# Patient Record
Sex: Male | Born: 1950
Health system: Southern US, Community
[De-identification: ages and names within clinical notes are randomized; demographics above are authoritative.]

## PROBLEM LIST (undated history)

## (undated) DIAGNOSIS — N529 Male erectile dysfunction, unspecified: Secondary | ICD-10-CM

## (undated) DIAGNOSIS — I10 Essential (primary) hypertension: Secondary | ICD-10-CM

## (undated) DIAGNOSIS — Z860101 Personal history of adenomatous and serrated colon polyps: Secondary | ICD-10-CM

## (undated) DIAGNOSIS — K219 Gastro-esophageal reflux disease without esophagitis: Secondary | ICD-10-CM

## (undated) DIAGNOSIS — I2510 Atherosclerotic heart disease of native coronary artery without angina pectoris: Secondary | ICD-10-CM

## (undated) DIAGNOSIS — Z86010 Personal history of colonic polyps: Secondary | ICD-10-CM

## (undated) DIAGNOSIS — M1990 Unspecified osteoarthritis, unspecified site: Secondary | ICD-10-CM

## (undated) DIAGNOSIS — E785 Hyperlipidemia, unspecified: Secondary | ICD-10-CM

## (undated) DIAGNOSIS — L409 Psoriasis, unspecified: Secondary | ICD-10-CM

## (undated) DIAGNOSIS — J309 Allergic rhinitis, unspecified: Secondary | ICD-10-CM

## (undated) DIAGNOSIS — M5030 Other cervical disc degeneration, unspecified cervical region: Secondary | ICD-10-CM

## (undated) DIAGNOSIS — Z974 Presence of external hearing-aid: Secondary | ICD-10-CM

## (undated) DIAGNOSIS — Z973 Presence of spectacles and contact lenses: Secondary | ICD-10-CM

## (undated) DIAGNOSIS — D45 Polycythemia vera: Secondary | ICD-10-CM

## (undated) DIAGNOSIS — G4700 Insomnia, unspecified: Secondary | ICD-10-CM

## (undated) HISTORY — PX: HAND SURGERY: SHX662

## (undated) HISTORY — DX: Polycythemia vera: D45

## (undated) HISTORY — DX: Gastro-esophageal reflux disease without esophagitis: K21.9

## (undated) HISTORY — DX: Psoriasis, unspecified: L40.9

## (undated) HISTORY — DX: Hyperlipidemia, unspecified: E78.5

## (undated) HISTORY — DX: Essential (primary) hypertension: I10

## (undated) HISTORY — DX: Insomnia, unspecified: G47.00

---

## 2005-04-01 ENCOUNTER — Ambulatory Visit: Payer: Self-pay

## 2005-06-04 ENCOUNTER — Ambulatory Visit: Payer: Self-pay

## 2005-12-12 ENCOUNTER — Ambulatory Visit: Payer: Self-pay | Admitting: General Surgery

## 2006-03-18 ENCOUNTER — Ambulatory Visit: Payer: Self-pay

## 2006-09-24 ENCOUNTER — Ambulatory Visit: Payer: Self-pay | Admitting: Internal Medicine

## 2006-10-06 ENCOUNTER — Ambulatory Visit: Payer: Self-pay | Admitting: Internal Medicine

## 2006-11-06 ENCOUNTER — Ambulatory Visit: Payer: Self-pay | Admitting: Internal Medicine

## 2006-12-05 ENCOUNTER — Ambulatory Visit: Payer: Self-pay | Admitting: Internal Medicine

## 2007-02-04 ENCOUNTER — Ambulatory Visit: Payer: Self-pay | Admitting: Internal Medicine

## 2007-03-04 ENCOUNTER — Ambulatory Visit: Payer: Self-pay | Admitting: Internal Medicine

## 2007-03-07 ENCOUNTER — Ambulatory Visit: Payer: Self-pay | Admitting: Internal Medicine

## 2007-09-06 ENCOUNTER — Ambulatory Visit: Payer: Self-pay | Admitting: Internal Medicine

## 2007-09-10 ENCOUNTER — Ambulatory Visit: Payer: Self-pay | Admitting: Internal Medicine

## 2007-10-07 ENCOUNTER — Ambulatory Visit: Payer: Self-pay | Admitting: Internal Medicine

## 2007-11-07 ENCOUNTER — Ambulatory Visit: Payer: Self-pay | Admitting: Internal Medicine

## 2007-12-05 ENCOUNTER — Ambulatory Visit: Payer: Self-pay | Admitting: Internal Medicine

## 2008-01-05 ENCOUNTER — Ambulatory Visit: Payer: Self-pay | Admitting: Internal Medicine

## 2008-01-06 ENCOUNTER — Ambulatory Visit: Payer: Self-pay | Admitting: Internal Medicine

## 2008-02-04 ENCOUNTER — Ambulatory Visit: Payer: Self-pay | Admitting: Internal Medicine

## 2008-03-06 ENCOUNTER — Ambulatory Visit: Payer: Self-pay | Admitting: Internal Medicine

## 2008-05-06 ENCOUNTER — Ambulatory Visit: Payer: Self-pay | Admitting: Internal Medicine

## 2008-06-02 ENCOUNTER — Ambulatory Visit: Payer: Self-pay | Admitting: Internal Medicine

## 2008-10-06 ENCOUNTER — Ambulatory Visit: Payer: Self-pay | Admitting: Internal Medicine

## 2008-10-25 ENCOUNTER — Ambulatory Visit: Payer: Self-pay | Admitting: Internal Medicine

## 2008-11-06 ENCOUNTER — Ambulatory Visit: Payer: Self-pay | Admitting: Internal Medicine

## 2008-12-04 ENCOUNTER — Ambulatory Visit: Payer: Self-pay | Admitting: Internal Medicine

## 2009-01-04 ENCOUNTER — Ambulatory Visit: Payer: Self-pay | Admitting: Internal Medicine

## 2009-01-11 ENCOUNTER — Ambulatory Visit: Payer: Self-pay | Admitting: Internal Medicine

## 2009-02-03 ENCOUNTER — Ambulatory Visit: Payer: Self-pay | Admitting: Internal Medicine

## 2009-09-05 ENCOUNTER — Ambulatory Visit: Payer: Self-pay | Admitting: Internal Medicine

## 2009-09-13 ENCOUNTER — Ambulatory Visit: Payer: Self-pay | Admitting: Internal Medicine

## 2009-10-06 ENCOUNTER — Ambulatory Visit: Payer: Self-pay | Admitting: Internal Medicine

## 2009-12-04 ENCOUNTER — Ambulatory Visit: Payer: Self-pay | Admitting: Internal Medicine

## 2010-01-03 ENCOUNTER — Ambulatory Visit: Payer: Self-pay | Admitting: Internal Medicine

## 2010-01-04 ENCOUNTER — Ambulatory Visit: Payer: Self-pay | Admitting: Internal Medicine

## 2010-04-05 ENCOUNTER — Ambulatory Visit: Payer: Self-pay | Admitting: Internal Medicine

## 2010-04-25 ENCOUNTER — Ambulatory Visit: Payer: Self-pay | Admitting: Internal Medicine

## 2010-05-06 ENCOUNTER — Ambulatory Visit: Payer: Self-pay | Admitting: Internal Medicine

## 2011-02-14 ENCOUNTER — Ambulatory Visit: Payer: Self-pay | Admitting: General Surgery

## 2011-07-04 ENCOUNTER — Ambulatory Visit: Payer: Self-pay | Admitting: Internal Medicine

## 2011-07-07 ENCOUNTER — Ambulatory Visit: Payer: Self-pay | Admitting: Internal Medicine

## 2011-08-07 ENCOUNTER — Ambulatory Visit: Payer: Self-pay | Admitting: Internal Medicine

## 2012-01-14 ENCOUNTER — Ambulatory Visit: Payer: Self-pay | Admitting: Internal Medicine

## 2012-02-04 ENCOUNTER — Ambulatory Visit: Payer: Self-pay | Admitting: Internal Medicine

## 2012-02-26 LAB — OCCULT BLOOD X 1 CARD TO LAB, STOOL: Occult Blood, Feces: NEGATIVE

## 2012-03-06 ENCOUNTER — Ambulatory Visit: Payer: Self-pay | Admitting: Internal Medicine

## 2012-04-05 ENCOUNTER — Ambulatory Visit: Payer: Self-pay | Admitting: Internal Medicine

## 2012-05-14 ENCOUNTER — Ambulatory Visit: Payer: Self-pay | Admitting: Internal Medicine

## 2012-05-14 LAB — OCCULT BLOOD X 1 CARD TO LAB, STOOL
Occult Blood, Feces: NEGATIVE
Occult Blood, Feces: NEGATIVE

## 2012-07-21 ENCOUNTER — Ambulatory Visit: Payer: Self-pay | Admitting: Internal Medicine

## 2012-07-23 ENCOUNTER — Ambulatory Visit: Payer: Self-pay | Admitting: Internal Medicine

## 2012-07-23 LAB — OCCULT BLOOD X 1 CARD TO LAB, STOOL
Occult Blood, Feces: NEGATIVE
Occult Blood, Feces: NEGATIVE

## 2012-08-06 ENCOUNTER — Ambulatory Visit: Payer: Self-pay | Admitting: Internal Medicine

## 2012-12-04 ENCOUNTER — Ambulatory Visit: Payer: Self-pay | Admitting: Internal Medicine

## 2013-01-04 ENCOUNTER — Ambulatory Visit: Payer: Self-pay | Admitting: Internal Medicine

## 2013-03-06 ENCOUNTER — Ambulatory Visit: Payer: Self-pay | Admitting: Internal Medicine

## 2013-03-30 LAB — OCCULT BLOOD X 1 CARD TO LAB, STOOL
Occult Blood, Feces: NEGATIVE
Occult Blood, Feces: NEGATIVE

## 2013-03-31 ENCOUNTER — Ambulatory Visit: Payer: Self-pay | Admitting: Internal Medicine

## 2013-04-05 ENCOUNTER — Ambulatory Visit: Payer: Self-pay | Admitting: Internal Medicine

## 2013-06-06 ENCOUNTER — Ambulatory Visit: Payer: Self-pay | Admitting: Internal Medicine

## 2013-07-06 ENCOUNTER — Ambulatory Visit: Payer: Self-pay | Admitting: Internal Medicine

## 2013-09-15 ENCOUNTER — Ambulatory Visit: Payer: Self-pay | Admitting: Internal Medicine

## 2013-10-06 ENCOUNTER — Ambulatory Visit: Payer: Self-pay | Admitting: Internal Medicine

## 2013-12-30 ENCOUNTER — Ambulatory Visit: Payer: Self-pay | Admitting: Internal Medicine

## 2014-01-04 ENCOUNTER — Ambulatory Visit: Payer: Self-pay | Admitting: Internal Medicine

## 2014-02-20 DIAGNOSIS — Z86018 Personal history of other benign neoplasm: Secondary | ICD-10-CM

## 2014-02-20 DIAGNOSIS — D239 Other benign neoplasm of skin, unspecified: Secondary | ICD-10-CM

## 2014-02-20 HISTORY — DX: Personal history of other benign neoplasm: Z86.018

## 2014-02-20 HISTORY — DX: Other benign neoplasm of skin, unspecified: D23.9

## 2014-03-22 ENCOUNTER — Ambulatory Visit: Payer: Self-pay | Admitting: Internal Medicine

## 2014-04-05 ENCOUNTER — Ambulatory Visit: Payer: Self-pay | Admitting: Internal Medicine

## 2014-10-04 ENCOUNTER — Ambulatory Visit: Payer: Self-pay | Admitting: Internal Medicine

## 2014-11-03 ENCOUNTER — Ambulatory Visit: Payer: Self-pay | Admitting: Hematology and Oncology

## 2014-11-03 LAB — FERRITIN: Ferritin (ARMC): 53 ng/mL (ref 8–388)

## 2014-11-03 LAB — CANCER CENTER HEMOGLOBIN: HGB: 14.8 g/dL (ref 13.0–18.0)

## 2014-11-06 ENCOUNTER — Ambulatory Visit: Payer: Self-pay | Admitting: Hematology and Oncology

## 2014-12-08 ENCOUNTER — Ambulatory Visit: Payer: Self-pay | Admitting: Gastroenterology

## 2014-12-10 DIAGNOSIS — K219 Gastro-esophageal reflux disease without esophagitis: Secondary | ICD-10-CM | POA: Insufficient documentation

## 2015-01-29 LAB — SURGICAL PATHOLOGY

## 2015-02-02 ENCOUNTER — Ambulatory Visit
Admit: 2015-02-02 | Disposition: A | Discharge status: Home / Self Care | Payer: Self-pay | Attending: Internal Medicine | Admitting: Internal Medicine

## 2015-02-02 LAB — COMPREHENSIVE METABOLIC PANEL
Albumin: 4.6 g/dL
Alkaline Phosphatase: 64 U/L
Anion Gap: 2 — ABNORMAL LOW (ref 7–16)
BUN: 35 mg/dL — ABNORMAL HIGH
Bilirubin,Total: 0.4 mg/dL
Calcium, Total: 9.3 mg/dL
Chloride: 109 mmol/L
Co2: 27 mmol/L
Creatinine: 1.54 mg/dL — ABNORMAL HIGH
EGFR (African American): 55 — ABNORMAL LOW
EGFR (Non-African Amer.): 47 — ABNORMAL LOW
Glucose: 143 mg/dL — ABNORMAL HIGH
Potassium: 4.1 mmol/L
SGOT(AST): 28 U/L
SGPT (ALT): 27 U/L
Sodium: 138 mmol/L
Total Protein: 7.1 g/dL

## 2015-02-02 LAB — CBC CANCER CENTER
Basophil #: 0 x10 3/mm (ref 0.0–0.1)
Basophil %: 0.5 %
Eosinophil #: 0.1 x10 3/mm (ref 0.0–0.7)
Eosinophil %: 1.3 %
HCT: 43.2 % (ref 40.0–52.0)
HGB: 15.1 g/dL (ref 13.0–18.0)
Lymphocyte #: 1.8 x10 3/mm (ref 1.0–3.6)
Lymphocyte %: 32.9 %
MCH: 31.7 pg (ref 26.0–34.0)
MCHC: 34.9 g/dL (ref 32.0–36.0)
MCV: 91 fL (ref 80–100)
Monocyte #: 0.3 x10 3/mm (ref 0.2–1.0)
Monocyte %: 6.5 %
Neutrophil #: 3.2 x10 3/mm (ref 1.4–6.5)
Neutrophil %: 58.8 %
Platelet: 295 x10 3/mm (ref 150–440)
RBC: 4.76 10*6/uL (ref 4.40–5.90)
RDW: 13 % (ref 11.5–14.5)
WBC: 5.4 x10 3/mm (ref 3.8–10.6)

## 2015-02-02 LAB — FERRITIN: Ferritin (ARMC): 40 ng/mL

## 2015-02-02 LAB — GAMMA GT: GGT: 15 U/L

## 2015-03-07 ENCOUNTER — Telehealth: Payer: Self-pay | Admitting: Family Medicine

## 2015-03-07 DIAGNOSIS — N289 Disorder of kidney and ureter, unspecified: Secondary | ICD-10-CM

## 2015-03-07 NOTE — Telephone Encounter (Signed)
Per Dr. Rutherford Nail orders will place order for referral to [facility] Dr. Jolaine Artist for abnormal kidney function lab results

## 2015-03-07 NOTE — Telephone Encounter (Signed)
PT IS WANTING A CALL BACK. HE IS RETURNING CALL

## 2015-03-08 ENCOUNTER — Other Ambulatory Visit: Payer: Self-pay | Admitting: Family Medicine

## 2015-03-08 NOTE — Telephone Encounter (Signed)
Spoke to Patients wife, [name], explained that Dr. Rutherford Nail would like him to stop the HCTZ and recheck labs in 2 months.  She voiced understanding.

## 2015-03-08 NOTE — Telephone Encounter (Signed)
Patient was informed by [facility] to stop HCTZ and recheck in 1 month

## 2015-05-03 ENCOUNTER — Other Ambulatory Visit: Payer: Self-pay

## 2015-05-04 ENCOUNTER — Inpatient Hospital Stay: Payer: BLUE CROSS/BLUE SHIELD

## 2015-05-11 ENCOUNTER — Inpatient Hospital Stay: Payer: BLUE CROSS/BLUE SHIELD | Attending: Family Medicine

## 2015-05-11 ENCOUNTER — Inpatient Hospital Stay: Payer: BLUE CROSS/BLUE SHIELD

## 2015-05-11 LAB — FERRITIN: Ferritin: 45 ng/mL (ref 24–336)

## 2015-05-11 LAB — HEMOGLOBIN: HEMOGLOBIN: 14.7 g/dL (ref 13.0–18.0)

## 2015-06-14 ENCOUNTER — Other Ambulatory Visit: Payer: Self-pay | Admitting: Family Medicine

## 2015-07-10 ENCOUNTER — Other Ambulatory Visit: Payer: Self-pay | Admitting: Family Medicine

## 2015-08-03 ENCOUNTER — Inpatient Hospital Stay: Payer: BLUE CROSS/BLUE SHIELD

## 2015-08-03 ENCOUNTER — Other Ambulatory Visit: Payer: Self-pay | Admitting: *Deleted

## 2015-08-03 ENCOUNTER — Inpatient Hospital Stay: Payer: BLUE CROSS/BLUE SHIELD | Attending: Internal Medicine | Admitting: Internal Medicine

## 2015-08-03 DIAGNOSIS — Z79899 Other long term (current) drug therapy: Secondary | ICD-10-CM | POA: Diagnosis not present

## 2015-08-03 DIAGNOSIS — Z7982 Long term (current) use of aspirin: Secondary | ICD-10-CM | POA: Diagnosis not present

## 2015-08-03 DIAGNOSIS — Z872 Personal history of diseases of the skin and subcutaneous tissue: Secondary | ICD-10-CM | POA: Insufficient documentation

## 2015-08-03 LAB — FERRITIN: FERRITIN: 74 ng/mL (ref 24–336)

## 2015-08-03 LAB — HEMOGLOBIN: Hemoglobin: 15.2 g/dL (ref 13.0–18.0)

## 2015-08-03 NOTE — Progress Notes (Signed)
[facility] OFFICE PROGRESS NOTE  Patient Care Team: Ashok Norris, MD as PCP - General (Family Medicine)   SUMMARY OF ONCOLOGIC HISTORY:  # ? HEMOCHROMATOSIS; Hereditary genotyping- NEG [2007] Phlebotomy 350 cc q 3-4 M  # Hx of psoriasis  INTERVAL HISTORY:   64 year old male patient with a history of elevated ferritin-  On phlebotomy  Every 3-4 months for the last 10 years  Is here for follow-up.  Patient denies any liver issues;  Denies any unusual swelling in the legs denies any unusual weight gain or weight loss. His appetite is good. He is asymptomatic.   REVIEW OF SYSTEMS:  A complete 10 point review of system is done which is negative except mentioned above/history of present illness.   PAST MEDICAL HISTORY : No past medical history on file.  PAST SURGICAL HISTORY :  No past surgical history on file.  FAMILY HISTORY :  No family history on file.  SOCIAL HISTORY:   Social History  Substance Use Topics  . Smoking status: Not on file  . Smokeless tobacco: Not on file  . Alcohol Use: Not on file    ALLERGIES:  is allergic to alcohol; other; wool alcohol ; and clobetasol.  MEDICATIONS:  Current Outpatient Prescriptions  Medication Sig Dispense Refill  . Adalimumab (HUMIRA PEN) 40 MG/0.8ML PNKT every 14 (fourteen) days.     Marland Kitchen aspirin EC 81 MG tablet Take by mouth.    Marland Kitchen atenolol (TENORMIN) 50 MG tablet     . calcium carbonate (OS-CAL - DOSED IN MG OF ELEMENTAL CALCIUM) 1250 (500 CA) MG tablet Take by mouth.    . fenofibrate (TRICOR) 145 MG tablet TAKE ONE TABLET AT BEDTIME 30 tablet 5  . LACTOBACILLUS ACID-PECTIN PO Take by mouth.    Marland Kitchen lisinopril-hydrochlorothiazide (PRINZIDE,ZESTORETIC) 10-12.5 MG tablet   5  . mupirocin cream (BACTROBAN) 2 % APPLY SPARINGLY AND RUB IN WELL TO AFFECTED AREAS TWICE A DAY 30 g 1  . omeprazole (PRILOSEC) 20 MG capsule Take 20 mg by mouth daily.    Marland Kitchen omeprazole (PRILOSEC) 40 MG capsule Take by mouth.    . zolpidem  (AMBIEN) 10 MG tablet TAKE ONE TABLET AT BEDTIME 30 tablet 5   No current facility-administered medications for this visit.    PHYSICAL EXAMINATION: ECOG PERFORMANCE STATUS: 0 - Asymptomatic  BP 139/76 mmHg  Pulse 62  Temp(Src) 98.2 F (36.8 C) (Tympanic)  Wt 181 lb 7 oz (82.3 kg)  Filed Weights   08/03/15 1354  Weight: 181 lb 7 oz (82.3 kg)    GENERAL: Well-nourished well-developed; Alert, no distress and comfortable.    Accompanied by his wife. EYES: no pallor or icterus OROPHARYNX: no thrush or ulceration; good dentition  NECK: supple, no masses felt LYMPH:  no palpable lymphadenopathy in the cervical, axillary or inguinal regions LUNGS: clear to auscultation and  No wheeze or crackles HEART/CVS: regular rate & rhythm and no murmurs; No lower extremity edema ABDOMEN:abdomen soft, non-tender and normal bowel sounds Musculoskeletal:no cyanosis of digits and no clubbing  PSYCH: alert & oriented x 3 with fluent speech NEURO: no focal motor/sensory deficits SKIN:  no rashes or significant lesions  LABORATORY DATA:  I have reviewed the data as listed    Component Value Date/Time   NA 138 02/02/2015 1405   K 4.1 02/02/2015 1405   CL 109 02/02/2015 1405   CO2 27 02/02/2015 1405   GLUCOSE 143* 02/02/2015 1405   BUN 35* 02/02/2015 1405   CREATININE 1.54*  02/02/2015 1405   CALCIUM 9.3 02/02/2015 1405   PROT 7.1 02/02/2015 1405   ALBUMIN 4.6 02/02/2015 1405   AST 28 02/02/2015 1405   ALT 27 02/02/2015 1405   ALKPHOS 64 02/02/2015 1405   BILITOT 0.4 02/02/2015 1405   GFRNONAA 47* 02/02/2015 1405   GFRAA 55* 02/02/2015 1405    No results found for: SPEP, UPEP  Lab Results  Component Value Date   WBC 5.4 02/02/2015   NEUTROABS 3.2 02/02/2015   HGB 15.2 08/03/2015   HCT 43.2 02/02/2015   MCV 91 02/02/2015   PLT 295 02/02/2015      Chemistry      Component Value Date/Time   NA 138 02/02/2015 1405   K 4.1 02/02/2015 1405   CL 109 02/02/2015 1405   CO2 27  02/02/2015 1405   BUN 35* 02/02/2015 1405   CREATININE 1.54* 02/02/2015 1405      Component Value Date/Time   CALCIUM 9.3 02/02/2015 1405   ALKPHOS 64 02/02/2015 1405   AST 28 02/02/2015 1405   ALT 27 02/02/2015 1405   BILITOT 0.4 02/02/2015 1405       RADIOGRAPHIC STUDIES: I have personally reviewed the radiological images as listed and agreed with the findings in the report. No results found.   ASSESSMENT & PLAN:   # Elevated ferritin; 2007 hemochromatosis genotype testing negative for any inherited causes. I reviewed with the patient/wife the pathology/biology of hemochromatosis. Most likely cause of patient's elevated ferritin- acute phase reactant from his history of psoriasis.  # After the long discussion patient and family felt it is reasonable to watch ferritin/CBC 6 months. Hold phlebotomy.  If patient's ferritin levels continue within normal limits;  That could potentially consider discharging the patient  From the clinic.    Orders Placed This Encounter  Procedures  . Hemoglobin    Standing Status: Future     Number of Occurrences:      Standing Expiration Date: 08/02/2016  . Ferritin    Standing Status: Future     Number of Occurrences:      Standing Expiration Date: 08/02/2016   All questions were answered. The patient knows to call the clinic with any problems, questions or concerns. No barriers to learning was detected.  I spent 25 minutes counseling the patient face to face. The total time spent in the appointment was 30 minutes and more than 50% was on counseling and review of test results     Cammie Sickle, MD 08/03/2015 2:58 PM

## 2015-08-03 NOTE — Progress Notes (Signed)
Patient states he has had a cough for about 5 weeks.  Patient was started on lisinopril in September.

## 2015-08-15 ENCOUNTER — Telehealth: Payer: Self-pay | Admitting: *Deleted

## 2015-08-15 NOTE — Telephone Encounter (Signed)
Patient's wife called. Patient has an appointment with Dr. Nehemiah Massed at [facility] next week for his humira injection. Patient is requesting that the labs from 08/03/15 to be faxed to Dr. Nehemiah Massed so that patient does not need to get stuck again for labs.  RN will forward this msg to our medical records team.

## 2015-08-28 ENCOUNTER — Ambulatory Visit (INDEPENDENT_AMBULATORY_CARE_PROVIDER_SITE_OTHER): Payer: BLUE CROSS/BLUE SHIELD | Admitting: Family Medicine

## 2015-08-28 ENCOUNTER — Encounter: Payer: Self-pay | Admitting: Family Medicine

## 2015-08-28 VITALS — BP 130/72 | HR 58 | Temp 98.1°F | Resp 16 | Ht 67.0 in | Wt 186.2 lb

## 2015-08-28 DIAGNOSIS — G4700 Insomnia, unspecified: Secondary | ICD-10-CM | POA: Diagnosis not present

## 2015-08-28 DIAGNOSIS — D45 Polycythemia vera: Secondary | ICD-10-CM | POA: Diagnosis not present

## 2015-08-28 DIAGNOSIS — E782 Mixed hyperlipidemia: Secondary | ICD-10-CM | POA: Insufficient documentation

## 2015-08-28 DIAGNOSIS — K219 Gastro-esophageal reflux disease without esophagitis: Secondary | ICD-10-CM | POA: Diagnosis not present

## 2015-08-28 DIAGNOSIS — I10 Essential (primary) hypertension: Secondary | ICD-10-CM | POA: Diagnosis not present

## 2015-08-28 DIAGNOSIS — J301 Allergic rhinitis due to pollen: Secondary | ICD-10-CM | POA: Diagnosis not present

## 2015-08-28 DIAGNOSIS — E785 Hyperlipidemia, unspecified: Secondary | ICD-10-CM | POA: Diagnosis not present

## 2015-08-28 DIAGNOSIS — Z23 Encounter for immunization: Secondary | ICD-10-CM

## 2015-08-28 MED ORDER — PREDNISONE 20 MG PO TABS: 20.0000 mg | ORAL_TABLET | Freq: Every day | ORAL | Status: DC

## 2015-08-28 MED ORDER — ZOLPIDEM TARTRATE 10 MG PO TABS: 10.0000 mg | ORAL_TABLET | Freq: Every day | ORAL | Status: DC

## 2015-08-28 MED ORDER — FLUTICASONE PROPIONATE 50 MCG/ACT NA SUSP: 2.0000 | Freq: Every day | NASAL | Status: DC

## 2015-08-28 MED ORDER — LOSARTAN POTASSIUM-HCTZ 50-12.5 MG PO TABS: 1.0000 | ORAL_TABLET | Freq: Every day | ORAL | Status: DC

## 2015-08-28 NOTE — Progress Notes (Signed)
Name: Christian Santos   MRN: [id number]    DOB: 1951-05-30   Date:08/28/2015       Progress Note  Subjective  Chief Complaint  Chief Complaint  Patient presents with  . Hypertension    4 monthy follow up  . Insomnia  . Hyperlipidemia    HPI  Hypertension   Patient presents for follow-up of hypertension. It has been present for over over 5 years.  Patient states that there is compliance with medical regimen which consists of Hyzaar 50-12 0.5 once daily atenolol 50 mg daily . There is no end organ disease. Cardiac risk factors include hypertension hyperlipidemia and diabetes.  Exercise regimen consist of some walking .  Diet consist of restricted salt  Insomnia  Patient has a history of insomnia for number of years. He is currently on Ambien 10 mg by mouth daily at bedtime which allows him to get to sleep within 30 minutes to an hour. He is not usually experiencing early a.m. awakening. There is no parasomnia.  Hyperlipidemia  Patient has a history of hyperlipidemia for over 5 years.  Current medical regimen consist of fenofibrate 145 once daily .  Compliance is good .  Diet and exercise are currently followed fairly well .  Risk factors for cardiovascular disease include hyperlipidemia hypertension and polycythemia .   There have been no side effects from the medication.   .  Hemochromatosis  Patient has a different hematologist now. After further reviewing the chart there is a question whether or not he indeed has hereditary hemochromatosis. He has not had phlebotomy in a number of months and will be going back in a month or so to recheck his iron and ferritin to see if he indeed needs to have this is an ongoing therapy  Allergic rhinitis  Complaint of recurrent nasal congestion and drainage which usually is clear and usually occurs at night. He's been using over-the-counter nasal spray with minimal improvement. There is no purulence and there is no fever or chills there's  minimal itching of the eyes. He does work externally and doing roofing   Past Medical History  Diagnosis Date  . Hypertension   . Hyperlipidemia   . Insomnia     Social History  Substance Use Topics  . Smoking status: Never Smoker   . Smokeless tobacco: Not on file  . Alcohol Use: No     Current outpatient prescriptions:  .  Adalimumab (HUMIRA PEN) 40 MG/0.8ML PNKT, every 14 (fourteen) days. , Disp: , Rfl:  .  aspirin EC 81 MG tablet, Take by mouth., Disp: , Rfl:  .  atenolol (TENORMIN) 50 MG tablet, , Disp: , Rfl:  .  calcium carbonate (OS-CAL - DOSED IN MG OF ELEMENTAL CALCIUM) 1250 (500 CA) MG tablet, Take by mouth., Disp: , Rfl:  .  fenofibrate (TRICOR) 145 MG tablet, TAKE ONE TABLET AT BEDTIME, Disp: 30 tablet, Rfl: 5 .  LACTOBACILLUS ACID-PECTIN PO, Take by mouth., Disp: , Rfl:  .  mupirocin cream (BACTROBAN) 2 %, APPLY SPARINGLY AND RUB IN WELL TO AFFECTED AREAS TWICE A DAY, Disp: 30 g, Rfl: 1 .  omeprazole (PRILOSEC) 20 MG capsule, Take 20 mg by mouth daily., Disp: , Rfl:  .  omeprazole (PRILOSEC) 40 MG capsule, Take by mouth., Disp: , Rfl:  .  zolpidem (AMBIEN) 10 MG tablet, TAKE ONE TABLET AT BEDTIME, Disp: 30 tablet, Rfl: 5 .  losartan-hydrochlorothiazide (HYZAAR) 50-12.5 MG tablet, Take 1 tablet by mouth daily., Disp: 90 tablet,  Rfl: 3  Allergies  Allergen Reactions  . Alcohol Other (See Comments)  . Other Other (See Comments)    Mycins = chest pain, [place] = skin test results.  Nilsa Nutting Alcohol  [Lanolin] Other (See Comments)  . Clobetasol Rash    Severe blisters    Review of Systems  Constitutional: Negative for fever, chills and weight loss.  HENT: Positive for congestion. Negative for hearing loss, sore throat and tinnitus.   Eyes: Negative for blurred vision, double vision and redness.  Respiratory: Negative for cough, hemoptysis and shortness of breath.   Cardiovascular: Negative for chest pain, palpitations, orthopnea, claudication and leg  swelling.  Gastrointestinal: Positive for heartburn. Negative for nausea, vomiting, diarrhea, constipation and blood in stool.  Genitourinary: Negative for dysuria, urgency, frequency and hematuria.  Musculoskeletal: Positive for back pain. Negative for myalgias, joint pain, falls and neck pain.  Skin: Negative for itching.  Neurological: Negative for dizziness, tingling, tremors, focal weakness, seizures, loss of consciousness, weakness and headaches.  Endo/Heme/Allergies: Does not bruise/bleed easily.  Psychiatric/Behavioral: Negative for depression and substance abuse. The patient is nervous/anxious and has insomnia.      Objective  Filed Vitals:   08/28/15 0900  BP: 130/72  Pulse: 58  Temp: 98.1 F (36.7 C)  TempSrc: Oral  Resp: 16  Height: 5\' 7"  (1.702 m)  Weight: 186 lb 3.2 oz (84.46 kg)  SpO2: 95%     Physical Exam  Constitutional: He is oriented to person, place, and time and well-developed, well-nourished, and in no distress.  HENT:  Head: Normocephalic.  Bilateral nasal ingestion with erythema and clear discharge  Eyes: EOM are normal. Pupils are equal, round, and reactive to light.  Neck: Normal range of motion. Neck supple. No thyromegaly present.  Cardiovascular: Normal rate, regular rhythm and normal heart sounds.   No murmur heard. Pulmonary/Chest: Effort normal and breath sounds normal. No respiratory distress. He has no wheezes.  Abdominal: Soft. Bowel sounds are normal.  Musculoskeletal: Normal range of motion. He exhibits no edema.  Lymphadenopathy:    He has no cervical adenopathy.  Neurological: He is alert and oriented to person, place, and time. No cranial nerve deficit. Gait normal. Coordination normal.  Skin: Skin is warm and dry. No rash noted.  Psychiatric: Affect and judgment normal.      Assessment & Plan  1. Gastro-esophageal reflux disease without esophagitis Stable - Comprehensive Metabolic Panel (CMET)  2. Insomnia Renew  Ambien  3. Essential hypertension Well-controlled - Lipid panel  4. Hyperlipidemia Labs - Lipid panel  5. Polycythemia vera ([place]) Per hematologist - TSH  6. Allergic rhinitis due to pollen Delsym and Rhinocort or Flonase. If this is not effective over-the-counter to consider generic Allegra  7. Need for influenza vaccination Given today - Flu Vaccine QUAD 36+ mos PF IM (Fluarix & Fluzone Quad PF)

## 2015-08-30 LAB — COMPREHENSIVE METABOLIC PANEL
ALBUMIN: 4.5 g/dL (ref 3.6–4.8)
ALK PHOS: 49 IU/L (ref 39–117)
ALT: 31 IU/L (ref 0–44)
AST: 28 IU/L (ref 0–40)
Albumin/Globulin Ratio: 2.4 (ref 1.1–2.5)
BILIRUBIN TOTAL: 0.5 mg/dL (ref 0.0–1.2)
BUN / CREAT RATIO: 21 (ref 10–22)
BUN: 25 mg/dL (ref 8–27)
CHLORIDE: 104 mmol/L (ref 97–106)
CO2: 22 mmol/L (ref 18–29)
Calcium: 9.5 mg/dL (ref 8.6–10.2)
Creatinine, Ser: 1.19 mg/dL (ref 0.76–1.27)
GFR calc Af Amer: 74 mL/min/{1.73_m2} (ref >59)
GFR calc non Af Amer: 64 mL/min/{1.73_m2} (ref >59)
GLUCOSE: 108 mg/dL — AB (ref 65–99)
Globulin, Total: 1.9 g/dL (ref 1.5–4.5)
Potassium: 4.6 mmol/L (ref 3.5–5.2)
Sodium: 141 mmol/L (ref 136–144)
Total Protein: 6.4 g/dL (ref 6.0–8.5)

## 2015-08-30 LAB — LIPID PANEL
CHOLESTEROL TOTAL: 128 mg/dL (ref 100–199)
Chol/HDL Ratio: 4.4 ratio units (ref 0.0–5.0)
HDL: 29 mg/dL — ABNORMAL LOW (ref >39)
LDL Calculated: 77 mg/dL (ref 0–99)
Triglycerides: 110 mg/dL (ref 0–149)
VLDL CHOLESTEROL CAL: 22 mg/dL (ref 5–40)

## 2015-08-30 LAB — TSH: TSH: 2.64 u[IU]/mL (ref 0.450–4.500)

## 2015-09-05 ENCOUNTER — Telehealth: Payer: Self-pay | Admitting: Emergency Medicine

## 2015-09-05 NOTE — Telephone Encounter (Signed)
Spoke to wife. Notified her of lab results

## 2015-09-17 ENCOUNTER — Telehealth: Payer: Self-pay | Admitting: Family Medicine

## 2015-09-17 MED ORDER — BENZONATATE 100 MG PO CAPS: 100.0000 mg | ORAL_CAPSULE | Freq: Three times a day (TID) | ORAL | Status: DC

## 2015-09-17 MED ORDER — AZITHROMYCIN 250 MG PO TABS: ORAL_TABLET | ORAL | Status: DC

## 2015-09-17 NOTE — Telephone Encounter (Signed)
Patient was seen a few weeks back for sinus infection. After taking all medications he is not better. Would like to know if there is something different he can take. The coughing is bad and the delysium is not working. Please send to [place].

## 2015-09-17 NOTE — Telephone Encounter (Signed)
Script sent to [place]

## 2015-09-17 NOTE — Telephone Encounter (Signed)
Z-Pak and Tessalon Perles 100 mg every 8 hours for 30

## 2015-10-07 DIAGNOSIS — M1990 Unspecified osteoarthritis, unspecified site: Secondary | ICD-10-CM

## 2015-10-07 DIAGNOSIS — T7840XA Allergy, unspecified, initial encounter: Secondary | ICD-10-CM

## 2015-10-07 HISTORY — DX: Unspecified osteoarthritis, unspecified site: M19.90

## 2015-10-07 HISTORY — DX: Allergy, unspecified, initial encounter: T78.40XA

## 2015-11-22 ENCOUNTER — Encounter: Payer: BLUE CROSS/BLUE SHIELD | Admitting: Family Medicine

## 2015-12-10 ENCOUNTER — Telehealth: Payer: Self-pay | Admitting: Family Medicine

## 2015-12-10 NOTE — Telephone Encounter (Signed)
Patient had appointment for march but had to resch for 4-19 for CPE. He will need refill on Fenofibrate and Zolpidem. Please send to [facility]

## 2015-12-11 ENCOUNTER — Other Ambulatory Visit: Payer: Self-pay | Admitting: Family Medicine

## 2015-12-20 ENCOUNTER — Other Ambulatory Visit: Payer: Self-pay | Admitting: Family Medicine

## 2015-12-25 ENCOUNTER — Encounter: Payer: BLUE CROSS/BLUE SHIELD | Admitting: Family Medicine

## 2016-01-02 ENCOUNTER — Other Ambulatory Visit: Payer: Self-pay | Admitting: Family Medicine

## 2016-01-23 ENCOUNTER — Encounter: Payer: BLUE CROSS/BLUE SHIELD | Admitting: Family Medicine

## 2016-02-01 ENCOUNTER — Other Ambulatory Visit: Payer: BLUE CROSS/BLUE SHIELD

## 2016-02-01 ENCOUNTER — Ambulatory Visit: Payer: BLUE CROSS/BLUE SHIELD | Admitting: Internal Medicine

## 2016-02-04 ENCOUNTER — Inpatient Hospital Stay: Payer: Self-pay | Admitting: Internal Medicine

## 2016-02-04 ENCOUNTER — Inpatient Hospital Stay: Payer: Self-pay

## 2016-02-09 ENCOUNTER — Other Ambulatory Visit: Payer: Self-pay | Admitting: Family Medicine

## 2016-02-20 ENCOUNTER — Inpatient Hospital Stay: Payer: Self-pay | Admitting: Internal Medicine

## 2016-02-20 ENCOUNTER — Inpatient Hospital Stay: Payer: Self-pay

## 2016-03-04 ENCOUNTER — Other Ambulatory Visit: Payer: Self-pay | Admitting: Family Medicine

## 2016-03-06 ENCOUNTER — Encounter: Payer: Self-pay | Admitting: Family Medicine

## 2016-03-06 ENCOUNTER — Ambulatory Visit (INDEPENDENT_AMBULATORY_CARE_PROVIDER_SITE_OTHER): Payer: BLUE CROSS/BLUE SHIELD | Admitting: Family Medicine

## 2016-03-06 VITALS — BP 138/76 | HR 83 | Temp 98.1°F | Resp 17 | Ht 67.0 in | Wt 184.5 lb

## 2016-03-06 DIAGNOSIS — E785 Hyperlipidemia, unspecified: Secondary | ICD-10-CM

## 2016-03-06 DIAGNOSIS — G4700 Insomnia, unspecified: Secondary | ICD-10-CM

## 2016-03-06 DIAGNOSIS — L089 Local infection of the skin and subcutaneous tissue, unspecified: Secondary | ICD-10-CM | POA: Diagnosis not present

## 2016-03-06 DIAGNOSIS — K219 Gastro-esophageal reflux disease without esophagitis: Secondary | ICD-10-CM | POA: Diagnosis not present

## 2016-03-06 DIAGNOSIS — A499 Bacterial infection, unspecified: Secondary | ICD-10-CM

## 2016-03-06 DIAGNOSIS — R739 Hyperglycemia, unspecified: Secondary | ICD-10-CM | POA: Diagnosis not present

## 2016-03-06 DIAGNOSIS — B9689 Other specified bacterial agents as the cause of diseases classified elsewhere: Secondary | ICD-10-CM | POA: Insufficient documentation

## 2016-03-06 LAB — GLUCOSE, POCT (MANUAL RESULT ENTRY): POC Glucose: 136 mg/dl — AB (ref 70–99)

## 2016-03-06 LAB — POCT GLYCOSYLATED HEMOGLOBIN (HGB A1C): HEMOGLOBIN A1C: 5.5

## 2016-03-06 MED ORDER — MUPIROCIN CALCIUM 2 % EX CREA: TOPICAL_CREAM | Freq: Every day | CUTANEOUS | Status: DC | PRN

## 2016-03-06 MED ORDER — OMEPRAZOLE 40 MG PO CPDR: 40.0000 mg | DELAYED_RELEASE_CAPSULE | Freq: Every day | ORAL | Status: DC

## 2016-03-06 MED ORDER — FENOFIBRATE 145 MG PO TABS: 145.0000 mg | ORAL_TABLET | Freq: Every day | ORAL | Status: DC

## 2016-03-06 MED ORDER — ZOLPIDEM TARTRATE 10 MG PO TABS: 10.0000 mg | ORAL_TABLET | Freq: Every evening | ORAL | Status: DC | PRN

## 2016-03-06 NOTE — Progress Notes (Signed)
Name: Christian Santos   MRN: [id number]    DOB: December 03, 1950   Date:03/06/2016       Progress Note  Subjective  Chief Complaint  Chief Complaint  Patient presents with  . Medication Refill    HPI  Hypertriglyceridemia: Pt. Requesting refill for Fenofibrate 145 mg daily, last TG level in November 2016 was within normal range. Pt. Compliant with therapy, no reported adverse effects.  Insomnia: Pt. Has difficulty staying asleep (wakes up frequently during the night without medication). He takes Ambien 10 mg at bedtime, which helps him stay asleep through the night. No side effects reported.  GERD: Pt. Has long-standing history of GERD, symptoms include heartburn, acid regurgitation, waking up wit acid in his throat. Spicy foods make his symptoms worse. Last endoscopy was unremarkable.   History of skin Infections: Pt. Involved with construction activities, gets frequent bruises, and cuts. He applies Mupirocin ointment in case of a small cut/bruise. Multiple superficial skin infections in the past.    Past Medical History  Diagnosis Date  . Hypertension   . Hyperlipidemia   . Insomnia     Past Surgical History  Procedure Laterality Date  . Hand surgery      History reviewed. No pertinent family history.  Social History   Social History  . Marital Status: Married    Spouse Name: N/A  . Number of Children: N/A  . Years of Education: N/A   Occupational History  . Not on file.   Social History Main Topics  . Smoking status: Never Smoker   . Smokeless tobacco: Not on file  . Alcohol Use: No  . Drug Use: No  . Sexual Activity:    Partners: Female   Other Topics Concern  . Not on file   Social History Narrative     Current outpatient prescriptions:  .  Adalimumab (HUMIRA PEN) 40 MG/0.8ML PNKT, every 14 (fourteen) days. , Disp: , Rfl:  .  Adalimumab (HUMIRA PEN) 40 MG/0.8ML PNKT, , Disp: , Rfl:  .  aspirin EC 81 MG tablet, Take by mouth., Disp: , Rfl:  .   atenolol (TENORMIN) 50 MG tablet, , Disp: , Rfl:  .  calcium carbonate (OS-CAL - DOSED IN MG OF ELEMENTAL CALCIUM) 1250 (500 CA) MG tablet, Take by mouth., Disp: , Rfl:  .  fenofibrate (TRICOR) 145 MG tablet, TAKE 1 TABLET BY MOUTH AT BEDTIME, Disp: 30 tablet, Rfl: 0 .  fluticasone (FLONASE) 50 MCG/ACT nasal spray, Place 2 sprays into both nostrils daily., Disp: 16 g, Rfl: 6 .  LACTOBACILLUS ACID-PECTIN PO, Take by mouth., Disp: , Rfl:  .  losartan-hydrochlorothiazide (HYZAAR) 50-12.5 MG tablet, Take 1 tablet by mouth daily., Disp: 90 tablet, Rfl: 3 .  mupirocin cream (BACTROBAN) 2 %, APPLY SPARINGLY AND RUB IN WELL TO AFFECTED AREAS TWICE A DAY, Disp: 30 g, Rfl: 1 .  naftifine (NAFTIN) 1 % cream, Apply topically., Disp: , Rfl:  .  omeprazole (PRILOSEC) 20 MG capsule, Take 20 mg by mouth daily., Disp: , Rfl:  .  omeprazole (PRILOSEC) 40 MG capsule, TAKE ONE CAPSULE DAILY, Disp: 30 capsule, Rfl: 2 .  predniSONE (DELTASONE) 20 MG tablet, Take 1 tablet (20 mg total) by mouth daily with breakfast., Disp: 10 tablet, Rfl: 0 .  zolpidem (AMBIEN) 10 MG tablet, Take 1 tablet (10 mg total) by mouth at bedtime., Disp: 30 tablet, Rfl: 2  Allergies  Allergen Reactions  . Alcohol Other (See Comments)  . Other Other (See Comments)  Mycins = chest pain, [place] = skin test results.  Nilsa Nutting Alcohol  [Lanolin] Other (See Comments)  . Clobetasol Rash    Severe blisters     Review of Systems  Constitutional: Negative for fever, chills and malaise/fatigue.  Gastrointestinal: Positive for heartburn. Negative for nausea, vomiting and abdominal pain.  Skin: Negative for rash.  Psychiatric/Behavioral: Negative for depression. The patient has insomnia.       Objective  Filed Vitals:   03/06/16 1333  BP: 138/76  Pulse: 83  Temp: 98.1 F (36.7 C)  TempSrc: Oral  Resp: 17  Height: 5\' 7"  (1.702 m)  Weight: 184 lb 8 oz (83.689 kg)  SpO2: 96%    Physical Exam  Constitutional: He is oriented  to person, place, and time and well-developed, well-nourished, and in no distress.  HENT:  Head: Normocephalic and atraumatic.  Cardiovascular: Normal rate and regular rhythm.   Pulmonary/Chest: Effort normal and breath sounds normal.  Abdominal: Soft. Bowel sounds are normal.  Neurological: He is alert and oriented to person, place, and time.  Psychiatric: Mood, memory, affect and judgment normal.  Nursing note and vitals reviewed.      Assessment & Plan  1. Hyperglycemia  - POCT HgB A1C - POCT Glucose (CBG)  2. Gastro-esophageal reflux disease without esophagitis  - omeprazole (PRILOSEC) 40 MG capsule; Take 1 capsule (40 mg total) by mouth daily.  Dispense: 90 capsule; Refill: 0  3. Insomnia  - zolpidem (AMBIEN) 10 MG tablet; Take 1 tablet (10 mg total) by mouth at bedtime as needed for sleep.  Dispense: 30 tablet; Refill: 2  4. Hyperlipidemia  - fenofibrate (TRICOR) 145 MG tablet; Take 1 tablet (145 mg total) by mouth at bedtime.  Dispense: 90 tablet; Refill: 0 - Lipid Profile - Comprehensive Metabolic Panel (CMET)  5. Localized bacterial infection of skin  - mupirocin cream (BACTROBAN) 2 %; Apply topically daily as needed.  Dispense: 30 g; Refill: 1   Johannah Rozas [facility] A[facility] 03/06/2016 1:49 PM

## 2016-03-07 ENCOUNTER — Ambulatory Visit: Payer: Self-pay | Admitting: Family Medicine

## 2016-03-07 ENCOUNTER — Other Ambulatory Visit: Payer: Self-pay

## 2016-03-13 ENCOUNTER — Telehealth: Payer: Self-pay | Admitting: Family Medicine

## 2016-03-13 DIAGNOSIS — Z111 Encounter for screening for respiratory tuberculosis: Secondary | ICD-10-CM

## 2016-03-13 NOTE — Telephone Encounter (Signed)
Patient was given a lab order. Was seen by Dr Lalla Brothers ([facility]) and they have decided that he is needing a TB Quantiferon Gold. Please print order.

## 2016-03-13 NOTE — Telephone Encounter (Signed)
Lab order for Quantiferon gold assay is ready for pickup

## 2016-03-14 ENCOUNTER — Other Ambulatory Visit: Payer: Self-pay | Admitting: Family Medicine

## 2016-03-14 NOTE — Telephone Encounter (Signed)
Left voice message informing patient to stop by to pick up lab order

## 2016-03-16 LAB — COMPREHENSIVE METABOLIC PANEL
ALBUMIN: 4.6 g/dL (ref 3.6–4.8)
ALT: 46 IU/L — ABNORMAL HIGH (ref 0–44)
AST: 30 IU/L (ref 0–40)
Albumin/Globulin Ratio: 2.2 (ref 1.2–2.2)
Alkaline Phosphatase: 75 IU/L (ref 39–117)
BUN / CREAT RATIO: 16 (ref 10–24)
BUN: 18 mg/dL (ref 8–27)
Bilirubin Total: 0.6 mg/dL (ref 0.0–1.2)
CO2: 23 mmol/L (ref 18–29)
CREATININE: 1.12 mg/dL (ref 0.76–1.27)
Calcium: 9.3 mg/dL (ref 8.6–10.2)
Chloride: 104 mmol/L (ref 96–106)
GFR calc non Af Amer: 69 mL/min/{1.73_m2} (ref >59)
GFR, EST AFRICAN AMERICAN: 80 mL/min/{1.73_m2} (ref >59)
GLOBULIN, TOTAL: 2.1 g/dL (ref 1.5–4.5)
GLUCOSE: 114 mg/dL — AB (ref 65–99)
Potassium: 4.6 mmol/L (ref 3.5–5.2)
SODIUM: 141 mmol/L (ref 134–144)
TOTAL PROTEIN: 6.7 g/dL (ref 6.0–8.5)

## 2016-03-16 LAB — LIPID PANEL
CHOL/HDL RATIO: 4.1 ratio (ref 0.0–5.0)
Cholesterol, Total: 130 mg/dL (ref 100–199)
HDL: 32 mg/dL — ABNORMAL LOW (ref >39)
LDL CALC: 78 mg/dL (ref 0–99)
Triglycerides: 101 mg/dL (ref 0–149)
VLDL CHOLESTEROL CAL: 20 mg/dL (ref 5–40)

## 2016-03-16 LAB — QUANTIFERON TB GOLD ASSAY (BLOOD)

## 2016-03-18 ENCOUNTER — Encounter: Payer: BLUE CROSS/BLUE SHIELD | Admitting: Family Medicine

## 2016-03-19 ENCOUNTER — Inpatient Hospital Stay (HOSPITAL_BASED_OUTPATIENT_CLINIC_OR_DEPARTMENT_OTHER): Payer: BLUE CROSS/BLUE SHIELD | Admitting: Internal Medicine

## 2016-03-19 ENCOUNTER — Inpatient Hospital Stay: Payer: BLUE CROSS/BLUE SHIELD | Attending: Internal Medicine

## 2016-03-19 VITALS — BP 147/75 | HR 59 | Temp 98.4°F | Resp 18 | Wt 184.5 lb

## 2016-03-19 DIAGNOSIS — I10 Essential (primary) hypertension: Secondary | ICD-10-CM | POA: Diagnosis not present

## 2016-03-19 DIAGNOSIS — Z79899 Other long term (current) drug therapy: Secondary | ICD-10-CM | POA: Insufficient documentation

## 2016-03-19 DIAGNOSIS — G4700 Insomnia, unspecified: Secondary | ICD-10-CM | POA: Insufficient documentation

## 2016-03-19 DIAGNOSIS — E785 Hyperlipidemia, unspecified: Secondary | ICD-10-CM | POA: Insufficient documentation

## 2016-03-19 DIAGNOSIS — Z7982 Long term (current) use of aspirin: Secondary | ICD-10-CM

## 2016-03-19 DIAGNOSIS — R7989 Other specified abnormal findings of blood chemistry: Secondary | ICD-10-CM

## 2016-03-19 DIAGNOSIS — L409 Psoriasis, unspecified: Secondary | ICD-10-CM | POA: Diagnosis not present

## 2016-03-19 LAB — FERRITIN: FERRITIN: 77 ng/mL (ref 24–336)

## 2016-03-19 LAB — HEMOGLOBIN: HEMOGLOBIN: 14.8 g/dL (ref 13.0–18.0)

## 2016-03-19 NOTE — Progress Notes (Signed)
[facility] OFFICE PROGRESS NOTE  Patient Care Team: Ashok Norris, MD as PCP - General (Family Medicine)   SUMMARY OF ONCOLOGIC HISTORY:  # ? HEMOCHROMATOSIS; Hereditary genotyping- NEG [2007] Phlebotomy 350 cc q 3-4 M  # Hx of psoriasis  INTERVAL HISTORY:   65 year old male patient with a history of elevated ferritin-  On phlebotomy  Every 3-4 months for the last 10 years  Is here for follow-up.  Patient has not had phlebotomy at least in the last 12 months. He continues to deny any liver disease  Denies any unusual swelling in the legs denies any unusual weight gain or weight loss. His appetite is good. He is asymptomatic.  REVIEW OF SYSTEMS:  A complete 10 point review of system is done which is negative except mentioned above/history of present illness.   PAST MEDICAL HISTORY :  Past Medical History  Diagnosis Date  . Hypertension   . Hyperlipidemia   . Insomnia     PAST SURGICAL HISTORY :   Past Surgical History  Procedure Laterality Date  . Hand surgery      FAMILY HISTORY :  No family history on file.  SOCIAL HISTORY:   Social History  Substance Use Topics  . Smoking status: Never Smoker   . Smokeless tobacco: Not on file  . Alcohol Use: No    ALLERGIES:  is allergic to alcohol; other; wool alcohol ; and clobetasol.  MEDICATIONS:  Current Outpatient Prescriptions  Medication Sig Dispense Refill  . Adalimumab (HUMIRA PEN) 40 MG/0.8ML PNKT every 14 (fourteen) days.     Marland Kitchen aspirin EC 81 MG tablet Take by mouth.    Marland Kitchen atenolol (TENORMIN) 50 MG tablet     . calcipotriene (DOVONOX) 0.005 % cream Apply topically 2 (two) times daily.    . calcipotriene-betamethasone (TACLONEX) ointment Apply topically daily.    . calcium carbonate (OS-CAL - DOSED IN MG OF ELEMENTAL CALCIUM) 1250 (500 CA) MG tablet Take by mouth.    . fenofibrate (TRICOR) 145 MG tablet Take 1 tablet (145 mg total) by mouth at bedtime. 90 tablet 0  . fluticasone (FLONASE) 50  MCG/ACT nasal spray Place 2 sprays into both nostrils daily. 16 g 6  . ketoconazole (NIZORAL) 2 % cream Apply 1 application topically daily.    Marland Kitchen LACTOBACILLUS ACID-PECTIN PO Take by mouth.    . Multiple Vitamins-Minerals (MULTIVITAMIN WITH MINERALS) tablet Take 1 tablet by mouth daily.    . mupirocin cream (BACTROBAN) 2 % Apply topically daily as needed. 30 g 1  . naftifine (NAFTIN) 1 % cream Apply topically.    Marland Kitchen omeprazole (PRILOSEC) 40 MG capsule Take 1 capsule (40 mg total) by mouth daily. 90 capsule 0  . ranitidine (ZANTAC) 75 MG tablet Take 75 mg by mouth 2 (two) times daily.    . valsartan (DIOVAN) 160 MG tablet Take 160 mg by mouth daily.    Marland Kitchen zolpidem (AMBIEN) 10 MG tablet Take 1 tablet (10 mg total) by mouth at bedtime as needed for sleep. 30 tablet 2   No current facility-administered medications for this visit.    PHYSICAL EXAMINATION: ECOG PERFORMANCE STATUS: 0 - Asymptomatic  BP 147/75 mmHg  Pulse 59  Temp(Src) 98.4 F (36.9 C) (Tympanic)  Resp 18  Wt 184 lb 8.4 oz (83.7 kg)  Filed Weights   03/19/16 1507  Weight: 184 lb 8.4 oz (83.7 kg)    GENERAL: Well-nourished well-developed; Alert, no distress and comfortable.    Accompanied by his wife.  EYES: no pallor or icterus OROPHARYNX: no thrush or ulceration; good dentition  NECK: supple, no masses felt LYMPH:  no palpable lymphadenopathy in the cervical, axillary or inguinal regions LUNGS: clear to auscultation and  No wheeze or crackles HEART/CVS: regular rate & rhythm and no murmurs; No lower extremity edema ABDOMEN:abdomen soft, non-tender and normal bowel sounds Musculoskeletal:no cyanosis of digits and no clubbing  PSYCH: alert & oriented x 3 with fluent speech NEURO: no focal motor/sensory deficits SKIN:  no rashes or significant lesions  LABORATORY DATA:  I have reviewed the data as listed    Component Value Date/Time   NA 141 03/14/2016 0717   NA 138 02/02/2015 1405   K 4.6 03/14/2016 0717   K 4.1  02/02/2015 1405   CL 104 03/14/2016 0717   CL 109 02/02/2015 1405   CO2 23 03/14/2016 0717   CO2 27 02/02/2015 1405   GLUCOSE 114* 03/14/2016 0717   GLUCOSE 143* 02/02/2015 1405   BUN 18 03/14/2016 0717   BUN 35* 02/02/2015 1405   CREATININE 1.12 03/14/2016 0717   CREATININE 1.54* 02/02/2015 1405   CALCIUM 9.3 03/14/2016 0717   CALCIUM 9.3 02/02/2015 1405   PROT 6.7 03/14/2016 0717   PROT 7.1 02/02/2015 1405   ALBUMIN 4.6 03/14/2016 0717   ALBUMIN 4.6 02/02/2015 1405   AST 30 03/14/2016 0717   AST 28 02/02/2015 1405   ALT 46* 03/14/2016 0717   ALT 27 02/02/2015 1405   ALKPHOS 75 03/14/2016 0717   ALKPHOS 64 02/02/2015 1405   BILITOT 0.6 03/14/2016 0717   BILITOT 0.4 02/02/2015 1405   GFRNONAA 69 03/14/2016 0717   GFRNONAA 47* 02/02/2015 1405   GFRAA 80 03/14/2016 0717   GFRAA 55* 02/02/2015 1405    No results found for: SPEP, UPEP  Lab Results  Component Value Date   WBC 5.4 02/02/2015   NEUTROABS 3.2 02/02/2015   HGB 14.8 03/19/2016   HCT 43.2 02/02/2015   MCV 91 02/02/2015   PLT 295 02/02/2015      Chemistry      Component Value Date/Time   NA 141 03/14/2016 0717   NA 138 02/02/2015 1405   K 4.6 03/14/2016 0717   K 4.1 02/02/2015 1405   CL 104 03/14/2016 0717   CL 109 02/02/2015 1405   CO2 23 03/14/2016 0717   CO2 27 02/02/2015 1405   BUN 18 03/14/2016 0717   BUN 35* 02/02/2015 1405   CREATININE 1.12 03/14/2016 0717   CREATININE 1.54* 02/02/2015 1405      Component Value Date/Time   CALCIUM 9.3 03/14/2016 0717   CALCIUM 9.3 02/02/2015 1405   ALKPHOS 75 03/14/2016 0717   ALKPHOS 64 02/02/2015 1405   AST 30 03/14/2016 0717   AST 28 02/02/2015 1405   ALT 46* 03/14/2016 0717   ALT 27 02/02/2015 1405   BILITOT 0.6 03/14/2016 0717   BILITOT 0.4 02/02/2015 1405       ASSESSMENT & PLAN:   # Elevated ferritin; 2007 hemochromatosis genotype testing negative for any inherited causes. Patient's most recent ferritin levels have been less than 100.  Hemoglobin today is normal. Ferritin is pending. If the ferritin is within normal limits I would not recommend any further follow-ups in the clinic. We will call him with the results of the ferritin.  # Patient was assured that if his ferritin was rising/ or if there are any concerns- we would be happy to bring him in for further evaluation and recommendations.  # He and  his wife seems to be agreeable with the plan. Patient will not have any follow-up visits at this time.     Cammie Sickle, MD 03/19/2016 4:06 PM

## 2016-03-20 ENCOUNTER — Telehealth: Payer: Self-pay | Admitting: *Deleted

## 2016-03-20 LAB — QUANTIFERON IN TUBE
QFT TB AG MINUS NIL VALUE: 0.12 IU/mL
QUANTIFERON MITOGEN VALUE: 6.23 [IU]/mL
QUANTIFERON NIL VALUE: 0.05 [IU]/mL
QUANTIFERON TB AG VALUE: 0.17 [IU]/mL
QUANTIFERON TB GOLD: NEGATIVE

## 2016-03-20 LAB — COMPREHENSIVE METABOLIC PANEL
ALT: 46 IU/L — AB (ref 0–44)
AST: 30 IU/L (ref 0–40)
Albumin/Globulin Ratio: 2.2 (ref 1.2–2.2)
Albumin: 4.6 g/dL (ref 3.6–4.8)
Alkaline Phosphatase: 75 IU/L (ref 39–117)
BUN/Creatinine Ratio: 16 (ref 10–24)
BUN: 18 mg/dL (ref 8–27)
Bilirubin Total: 0.6 mg/dL (ref 0.0–1.2)
CALCIUM: 9.3 mg/dL (ref 8.6–10.2)
CO2: 23 mmol/L (ref 18–29)
CREATININE: 1.12 mg/dL (ref 0.76–1.27)
Chloride: 104 mmol/L (ref 96–106)
GFR calc Af Amer: 80 mL/min/{1.73_m2} (ref >59)
GFR, EST NON AFRICAN AMERICAN: 69 mL/min/{1.73_m2} (ref >59)
GLUCOSE: 114 mg/dL — AB (ref 65–99)
Globulin, Total: 2.1 g/dL (ref 1.5–4.5)
Potassium: 4.6 mmol/L (ref 3.5–5.2)
Sodium: 141 mmol/L (ref 134–144)
Total Protein: 6.7 g/dL (ref 6.0–8.5)

## 2016-03-20 LAB — LIPID PANEL
CHOL/HDL RATIO: 4.1 ratio (ref 0.0–5.0)
CHOLESTEROL TOTAL: 130 mg/dL (ref 100–199)
HDL: 32 mg/dL — ABNORMAL LOW (ref >39)
LDL Calculated: 78 mg/dL (ref 0–99)
Triglycerides: 101 mg/dL (ref 0–149)
VLDL Cholesterol Cal: 20 mg/dL (ref 5–40)

## 2016-03-20 LAB — QUANTIFERON TB GOLD ASSAY (BLOOD)

## 2016-03-20 NOTE — Telephone Encounter (Signed)
-----   Message from Cammie Sickle, MD sent at 03/19/2016  7:51 PM EDT ----- Please inform patient that ferritin is normal; no further follow-ups needed in the clinic. He will continue follow up with PCP. Thx

## 2016-03-20 NOTE — Telephone Encounter (Signed)
Called patient and spoke to wife.  Informed her patient's ferritin levels are normal.  No further follow up in our clinic is needed.  Patient advised to follow up with PCP.  Verbalized understanding.

## 2016-04-02 ENCOUNTER — Encounter: Payer: BLUE CROSS/BLUE SHIELD | Admitting: Family Medicine

## 2016-04-15 ENCOUNTER — Encounter: Payer: Self-pay | Admitting: Family Medicine

## 2016-04-15 ENCOUNTER — Ambulatory Visit (INDEPENDENT_AMBULATORY_CARE_PROVIDER_SITE_OTHER): Payer: BLUE CROSS/BLUE SHIELD | Admitting: Family Medicine

## 2016-04-15 ENCOUNTER — Telehealth: Payer: Self-pay | Admitting: Family Medicine

## 2016-04-15 VITALS — BP 112/60 | HR 64 | Temp 98.2°F | Resp 16 | Ht 67.0 in | Wt 184.2 lb

## 2016-04-15 DIAGNOSIS — Z0001 Encounter for general adult medical examination with abnormal findings: Secondary | ICD-10-CM | POA: Diagnosis not present

## 2016-04-15 DIAGNOSIS — Z0000 Encounter for general adult medical examination without abnormal findings: Secondary | ICD-10-CM | POA: Insufficient documentation

## 2016-04-15 DIAGNOSIS — Z1211 Encounter for screening for malignant neoplasm of colon: Secondary | ICD-10-CM | POA: Diagnosis not present

## 2016-04-15 DIAGNOSIS — R9431 Abnormal electrocardiogram [ECG] [EKG]: Secondary | ICD-10-CM

## 2016-04-15 LAB — TSH: TSH: 1.54 m[IU]/L (ref 0.40–4.50)

## 2016-04-15 NOTE — Progress Notes (Signed)
Name: Christian Santos   MRN: [id number]    DOB: 28-Jul-1951   Date:04/15/2016       Progress Note  Subjective  Chief Complaint  Chief Complaint  Patient presents with  . Annual Exam    HPI  Pt. Is here for a Complete Physical Exam. He is doing well. Last colonoscopy was 5 years ago and due for repeat c-scope this year. Last prostate exam was 1 year ago, during last physical.   Past Medical History  Diagnosis Date  . Hypertension   . Hyperlipidemia   . Insomnia     Past Surgical History  Procedure Laterality Date  . Hand surgery      History reviewed. No pertinent family history.  Social History   Social History  . Marital Status: Married    Spouse Name: N/A  . Number of Children: N/A  . Years of Education: N/A   Occupational History  . Not on file.   Social History Main Topics  . Smoking status: Never Smoker   . Smokeless tobacco: Not on file  . Alcohol Use: No  . Drug Use: No  . Sexual Activity:    Partners: Female   Other Topics Concern  . Not on file   Social History Narrative     Current outpatient prescriptions:  .  Adalimumab (HUMIRA PEN) 40 MG/0.8ML PNKT, every 14 (fourteen) days. , Disp: , Rfl:  .  aspirin EC 81 MG tablet, Take by mouth., Disp: , Rfl:  .  calcipotriene (DOVONOX) 0.005 % cream, Apply topically 2 (two) times daily., Disp: , Rfl:  .  calcipotriene-betamethasone (TACLONEX) ointment, Apply topically daily., Disp: , Rfl:  .  calcium carbonate (OS-CAL - DOSED IN MG OF ELEMENTAL CALCIUM) 1250 (500 CA) MG tablet, Take by mouth., Disp: , Rfl:  .  fenofibrate (TRICOR) 145 MG tablet, Take 1 tablet (145 mg total) by mouth at bedtime., Disp: 90 tablet, Rfl: 0 .  fluticasone (FLONASE) 50 MCG/ACT nasal spray, Place 2 sprays into both nostrils daily., Disp: 16 g, Rfl: 6 .  ketoconazole (NIZORAL) 2 % cream, Apply 1 application topically daily., Disp: , Rfl:  .  LACTOBACILLUS ACID-PECTIN PO, Take by mouth., Disp: , Rfl:  .  Multiple  Vitamins-Minerals (MULTIVITAMIN WITH MINERALS) tablet, Take 1 tablet by mouth daily., Disp: , Rfl:  .  mupirocin cream (BACTROBAN) 2 %, Apply topically daily as needed., Disp: 30 g, Rfl: 1 .  naftifine (NAFTIN) 1 % cream, Apply topically., Disp: , Rfl:  .  omeprazole (PRILOSEC) 40 MG capsule, Take 1 capsule (40 mg total) by mouth daily., Disp: 90 capsule, Rfl: 0 .  ranitidine (ZANTAC) 75 MG tablet, Take 75 mg by mouth 2 (two) times daily., Disp: , Rfl:  .  valsartan (DIOVAN) 160 MG tablet, Take 160 mg by mouth daily., Disp: , Rfl:  .  zolpidem (AMBIEN) 10 MG tablet, Take 1 tablet (10 mg total) by mouth at bedtime as needed for sleep., Disp: 30 tablet, Rfl: 2 .  atenolol (TENORMIN) 50 MG tablet, Reported on 04/15/2016, Disp: , Rfl:   Allergies  Allergen Reactions  . Alcohol Other (See Comments)  . Other Other (See Comments)    Mycins = chest pain, [place] = skin test results.  Christian Santos Alcohol  [Lanolin] Other (See Comments)  . Clobetasol Rash    Severe blisters     Review of Systems  Constitutional: Negative for fever, chills and malaise/fatigue.  Eyes: Negative for blurred vision.  Respiratory: Negative  for cough and shortness of breath.   Cardiovascular: Negative for chest pain.  Gastrointestinal: Negative for nausea, vomiting and blood in stool.  Genitourinary: Negative for dysuria and hematuria.  Musculoskeletal: Negative for back pain.  Skin: Negative for rash.  Neurological: Negative for headaches.  Psychiatric/Behavioral: Negative for depression. The patient is not nervous/anxious.     Objective  Filed Vitals:   04/15/16 0911  BP: 112/60  Pulse: 64  Temp: 98.2 F (36.8 C)  TempSrc: Oral  Resp: 16  Height: 5\' 7"  (1.702 m)  Weight: 184 lb 3.2 oz (83.553 kg)  SpO2: 97%    Physical Exam  Constitutional: He is oriented to person, place, and time and well-developed, well-nourished, and in no distress.  HENT:  Head: Normocephalic and atraumatic.  Eyes:  Conjunctivae are normal. Pupils are equal, round, and reactive to light.  Neck: Normal range of motion. Neck supple.  Cardiovascular: Normal rate, regular rhythm and normal heart sounds.   No murmur heard. Pulmonary/Chest: Effort normal and breath sounds normal. No respiratory distress. He has no wheezes.  Abdominal: Soft. Bowel sounds are normal. There is no tenderness.  Genitourinary: Rectum normal and prostate normal. Prostate is not enlarged and not tender.  Musculoskeletal: He exhibits no edema.       Right ankle: He exhibits no swelling.       Left ankle: He exhibits no swelling.  Neurological: He is alert and oriented to person, place, and time.  Skin: Skin is warm and dry.  Psychiatric: Memory, affect and judgment normal.  Nursing note and vitals reviewed.      Assessment & Plan  1. Annual physical exam  - TSH - PSA - Vitamin D (25 hydroxy) - EKG 12-Lead  2. Abnormal EKG Last stress test was over 10 years ago, inverted T-waves on EKG, no prior EKG available for comparison. Will refer to Cardiology for further evaluation.   - Ambulatory referral to Cardiology  3. Colon cancer screening  - Ambulatory referral to Gastroenterology   [facility] 04/15/2016 9:35 AM

## 2016-04-15 NOTE — Telephone Encounter (Signed)
PT IS ASKING TO BE REFERRED TO DR Rockey Situ AT [facility]

## 2016-04-15 NOTE — Telephone Encounter (Signed)
Referral has already been done and it was sent to [facility]

## 2016-04-15 NOTE — Telephone Encounter (Signed)
LFT MESS ABOUT REFERRAL ALREADY DONE AND JUST GIVE [facility] A CALL ABOUT WHO THE DR IS.

## 2016-04-16 LAB — VITAMIN D 25 HYDROXY (VIT D DEFICIENCY, FRACTURES): Vit D, 25-Hydroxy: 35 ng/mL (ref 30–100)

## 2016-04-16 LAB — PSA: PSA: 1.48 ng/mL (ref <=4.00)

## 2016-04-18 ENCOUNTER — Telehealth: Payer: Self-pay | Admitting: Emergency Medicine

## 2016-04-18 NOTE — Telephone Encounter (Signed)
Patient notified of lab results

## 2016-05-01 ENCOUNTER — Encounter: Payer: Self-pay | Admitting: Cardiovascular Disease

## 2016-05-01 ENCOUNTER — Ambulatory Visit (INDEPENDENT_AMBULATORY_CARE_PROVIDER_SITE_OTHER): Payer: BLUE CROSS/BLUE SHIELD | Admitting: Cardiovascular Disease

## 2016-05-01 VITALS — BP 144/88 | HR 53 | Ht 66.0 in | Wt 183.5 lb

## 2016-05-01 DIAGNOSIS — I10 Essential (primary) hypertension: Secondary | ICD-10-CM

## 2016-05-01 DIAGNOSIS — R9431 Abnormal electrocardiogram [ECG] [EKG]: Secondary | ICD-10-CM | POA: Diagnosis not present

## 2016-05-01 DIAGNOSIS — E785 Hyperlipidemia, unspecified: Secondary | ICD-10-CM | POA: Diagnosis not present

## 2016-05-01 DIAGNOSIS — R739 Hyperglycemia, unspecified: Secondary | ICD-10-CM | POA: Diagnosis not present

## 2016-05-01 NOTE — Progress Notes (Signed)
Cardiology Office Note  Date:  05/01/2016   ID:  Christian Santos, DOB 1951-01-25, MRN [id number]  PCP:  Keith Rake, MD   Chief Complaint  Patient presents with  . Other    No complaints today ref abn EKG. Meds reviewed verbally with pt.    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 Manuella Ghazi for consultation of his abnormal EKG.  Previous EKG from 2 weeks ago shows diffuse T-wave abnormality to the precordial leads, inferior leads.  EKG again today showing normal sinus rhythm with T-wave abnormality in V2 through V6, 2, 3, aVF  He reports that he feels well, very active at baseline, works long hours. Recently he reports doing 9 hour work days working in [employer], very hot conditions with no symptoms.  He does report having episode of severe left arm pain in the past, several years ago. At that time had a stress test and was told it was normal. Reports it was a nuclear stress test  No recent testing  Reports a strong family history. Brother is a long-time smoker, recently had open heart surgery Father had possible silent heart attack and then a big heart attack at age 58. He was not a smoker  Notes indicating. Dehydration with renal dysfunction, lab work back to normal with hydration Previously seen by Dr. Abigail Butts  Denies any symptoms concerning for angina him a no chest pain on exertion  Review of lab work shows total cholesterol 130, LDL 78 Tolerating fenofibrate   PMH:   has a past medical history of Hyperlipidemia; Hypertension; and Insomnia.  PSH:    Past Surgical History:  Procedure Laterality Date  . HAND SURGERY      Current Outpatient Prescriptions  Medication Sig Dispense Refill  . Adalimumab (HUMIRA PEN) 40 MG/0.8ML PNKT every 14 (fourteen) days.     Marland Kitchen amLODipine (NORVASC) 5 MG tablet Take 5 mg by mouth daily.    Marland Kitchen aspirin EC 81 MG tablet Take by mouth.    . calcium carbonate (OS-CAL -  DOSED IN MG OF ELEMENTAL CALCIUM) 1250 (500 CA) MG tablet Take by mouth.    . fenofibrate (TRICOR) 145 MG tablet Take 1 tablet (145 mg total) by mouth at bedtime. 90 tablet 0  . fluticasone (FLONASE) 50 MCG/ACT nasal spray Place 2 sprays into both nostrils daily. 16 g 6  . ketoconazole (NIZORAL) 2 % cream Apply 1 application topically daily.    Marland Kitchen LACTOBACILLUS ACID-PECTIN PO Take by mouth.    . magnesium gluconate (MAGONATE) 500 MG tablet Take 1,000 mg by mouth daily.    . Multiple Vitamins-Minerals (MULTIVITAMIN WITH MINERALS) tablet Take 1 tablet by mouth daily.    . mupirocin cream (BACTROBAN) 2 % Apply topically daily as needed. 30 g 1  . naftifine (NAFTIN) 1 % cream Apply topically.    Marland Kitchen omeprazole (PRILOSEC) 40 MG capsule Take 1 capsule (40 mg total) by mouth daily. 90 capsule 0  . ranitidine (ZANTAC) 75 MG tablet Take 75 mg by mouth 2 (two) times daily.    . valsartan (DIOVAN) 160 MG tablet Take 160 mg by mouth daily.    Marland Kitchen zolpidem (AMBIEN) 10 MG tablet Take 1 tablet (10 mg total) by mouth at bedtime as needed for sleep. 30 tablet 2   No current facility-administered medications for this visit.      Allergies:   Alcohol; Lisinopril; Other; Wool alcohol  [lanolin]; and Clobetasol   Social  History:  The patient  reports that he has never smoked. He has never used smokeless tobacco. He reports that he drinks alcohol. He reports that he does not use drugs.   Family History:   family history includes Heart attack in his brother and father; Heart disease in his brother.    Review of Systems: Review of Systems  Constitutional: Negative.   Respiratory: Negative.   Cardiovascular: Negative.   Gastrointestinal: Negative.   Musculoskeletal: Negative.   Neurological: Negative.   Psychiatric/Behavioral: Negative.   All other systems reviewed and are negative.    PHYSICAL EXAM: VS:  BP (!) 144/88 (BP Location: Left Arm, Patient Position: Sitting, Cuff Size: Normal)   Pulse (!) 53    Ht 5\' 6"  (1.676 m)   Wt 183 lb 8 oz (83.2 kg)   BMI 29.62 kg/m  , BMI Body mass index is 29.62 kg/m. GEN: Well nourished, well developed, in no acute distress  HEENT: normal  Neck: no JVD, carotid bruits, or masses Cardiac: RRR; no murmurs, rubs, or gallops,no edema  Respiratory:  clear to auscultation bilaterally, normal work of breathing GI: soft, nontender, nondistended, + BS MS: no deformity or atrophy  Skin: warm and dry, no rash Neuro:  Strength and sensation are intact Psych: euthymic mood, full affect    Recent Labs: 03/14/2016: ALT 46; ALT 46; BUN 18; BUN 18; Creatinine, Ser 1.12; Creatinine, Ser 1.12; Potassium 4.6; Potassium 4.6; Sodium 141; Sodium 141 03/19/2016: Hemoglobin 14.8 04/15/2016: TSH 1.54    Lipid Panel Lab Results  Component Value Date   CHOL 130 03/14/2016   CHOL 130 03/14/2016   HDL 32 (L) 03/14/2016   HDL 32 (L) 03/14/2016   LDLCALC 78 03/14/2016   LDLCALC 78 03/14/2016   TRIG 101 03/14/2016   TRIG 101 03/14/2016      Wt Readings from Last 3 Encounters:  05/01/16 183 lb 8 oz (83.2 kg)  04/15/16 184 lb 3.2 oz (83.6 kg)  03/19/16 184 lb 8.4 oz (83.7 kg)       ASSESSMENT AND PLAN:  Essential hypertension - Plan: EKG 12-Lead, CT CARDIAC SCORING Blood pressure borderline elevated on today's visit. He reports it is well controlled at home No medication changes made Recommended he call our office if this continues to run high  Abnormal EKG - Plan: CT CARDIAC SCORING Diffuse T-wave abnormality, no previous EKGs for comparison apart from 2 weeks ago Relatively asymptomatic, no symptoms concerning for angina Nonsmoker, glucose intolerance, cholesterol well controlled Discussed various treatment options with him in detail including CT coronary calcium scoring, nuclear stress testing, stress echocardiogram and plain treadmill.  After discussion, he prefers CT coronary calcium scoring for risk stratification This is scheduled for next week If  score is 0, very low likelihood of underlying coronary disease  Hyperlipidemia Cholesterol is at goal on the current lipid regimen. No changes to the medications were made.  Hyperglycemia Recommended low carbohydrate diet, continued exercise    Total encounter time more than 45 minutes  Greater than 50% was spent in counseling and coordination of care with the patient   Disposition:   F/U  prn   Orders Placed This Encounter  Procedures  . CT CARDIAC SCORING  . EKG 12-Lead     Signed, Esmond Plants, M.D., Ph.D. 05/01/2016  [place], [place]

## 2016-05-01 NOTE — Patient Instructions (Addendum)
Medication Instructions:  No medication changes   Testing/Procedures: We will schedule a CT coronary calcium score for abnormal EKG, family hx Friday, Aug 4 @ 9:00, please arrive @ 8:45 [name] is scheduled for 9:30 There is a one-time fee of $150 each due at the time of your procedures. [place]. [address] [place], [place]  Follow-Up: It was a pleasure seeing you in the office today. Please call us if you have new issues that need to be addressed before your next appt.  [phone]  Your physician wants you to follow-up in: as needed You will receive a reminder letter in the mail two months in advance. If you don't receive a letter, please call our office to schedule the follow-up appointment.  If you need a refill on your cardiac medications before your next appointment, please call your pharmacy.   Coronary Calcium Scan A coronary calcium scan is an imaging test used to look for deposits of calcium and other fatty materials (plaques) in the inner lining of the blood vessels of your heart (coronary arteries). These deposits of calcium and plaques can partly clog and narrow the coronary arteries without producing any symptoms or warning signs. This puts you at risk for a heart attack. This test can detect these deposits before symptoms develop.  LET [facility] CARE PROVIDER KNOW ABOUT:  Any allergies you have.  All medicines you are taking, including vitamins, herbs, eye drops, creams, and over-the-counter medicines.  Previous problems you or members of your family have had with the use of anesthetics.  Any blood disorders you have.  Previous surgeries you have had.  Medical conditions you have.  Possibility of pregnancy, if this applies. RISKS AND COMPLICATIONS Generally, this is a safe procedure. However, as with any procedure, complications can occur. This test involves the use of radiation. Radiation exposure can be dangerous to a pregnant woman and her  unborn baby. If you are pregnant, you should not have this procedure done.  BEFORE THE PROCEDURE There is no special preparation for the procedure. PROCEDURE  You will need to undress and put on a hospital gown. You will need to remove any jewelry around your neck or chest.  Sticky electrodes are placed on your chest and are connected to an electrocardiogram (EKG or electrocardiography) machine to recorda tracing of the electrical activity of your heart.  A CT scanner will take pictures of your heart. During this time, you will be asked to lie still and hold your breath for 2-3 seconds while a picture is being taken of your heart. AFTER THE PROCEDURE   You will be allowed to get dressed.  You can return to your normal activities after the scan is done.   This information is not intended to replace advice given to you by your health care provider. Make sure you discuss any questions you have with your health care provider.   Document Released: 03/20/2008 Document Revised: 09/27/2013 Document Reviewed: 05/30/2013 Elsevier Interactive Patient Education [employer].

## 2016-05-09 ENCOUNTER — Ambulatory Visit (INDEPENDENT_AMBULATORY_CARE_PROVIDER_SITE_OTHER)
Admission: RE | Admit: 2016-05-09 | Discharge: 2016-05-09 | Disposition: A | Discharge status: Home / Self Care | Payer: BLUE CROSS/BLUE SHIELD | Source: Ambulatory Visit | Attending: Cardiovascular Disease | Admitting: Cardiovascular Disease

## 2016-05-09 DIAGNOSIS — I10 Essential (primary) hypertension: Secondary | ICD-10-CM

## 2016-05-09 DIAGNOSIS — R9431 Abnormal electrocardiogram [ECG] [EKG]: Secondary | ICD-10-CM

## 2016-05-13 ENCOUNTER — Telehealth: Payer: Self-pay | Admitting: Cardiovascular Disease

## 2016-05-13 NOTE — Telephone Encounter (Signed)
Reviewed preliminary results with patient on CT calcium score. Let him know that Dr. Rockey Situ may want further testing and would review once he returned. He verbalized understanding and had no further questions at this time.

## 2016-05-16 ENCOUNTER — Telehealth: Payer: Self-pay | Admitting: *Deleted

## 2016-05-16 ENCOUNTER — Encounter: Payer: Self-pay | Admitting: Cardiovascular Disease

## 2016-05-16 DIAGNOSIS — E785 Hyperlipidemia, unspecified: Secondary | ICD-10-CM

## 2016-05-16 DIAGNOSIS — I10 Essential (primary) hypertension: Secondary | ICD-10-CM

## 2016-05-16 MED ORDER — ROSUVASTATIN CALCIUM 10 MG PO TABS: 10.0000 mg | ORAL_TABLET | Freq: Every day | ORAL | 6 refills | Status: DC

## 2016-05-16 NOTE — Telephone Encounter (Signed)
-----   Message from Minna Merritts, MD sent at 05/16/2016  1:49 PM EDT ----- CT coronary calcium score Score is very elevated, 870, which is 93rd percentile based on his age Coronary disease seen in all three vessels, high build up in vessel down the front, LAD Would consider stress test on day I am in the hospital given EKG changes, high score Needs follow up non contrast CT chest for 6 mm nodule in 6 to 12 months per the radiology report Needs clinic follow up on yearly basis

## 2016-05-16 NOTE — Telephone Encounter (Signed)
Spoke with patients wife and reviewed test results and recommendations from Dr. Rockey Situ. Scheduled exercise myoview stress test on 05/21/16 at 07:30AM and instructed her to have him register at 07:15AM. Reviewed instructions for stress test and she verbalized understanding with no further questions. Also sent in prescription for crestor 10 mg daily and reviewed that as well. She verbalized understanding of all instructions and had no further questions at this time. Instructed her to call back if she had any questions or if they needed to reschedule that.

## 2016-05-18 ENCOUNTER — Encounter: Payer: Self-pay | Admitting: Cardiovascular Disease

## 2016-05-20 ENCOUNTER — Telehealth: Payer: Self-pay | Admitting: Cardiovascular Disease

## 2016-05-20 NOTE — Telephone Encounter (Signed)
Pt sent MyChart message that home phone out of service. Called pt on cell to review 8/16 myoview instructions. Left detailed message with CB number on pt cell VM. Sent reminder through [employer].

## 2016-05-21 ENCOUNTER — Encounter: Payer: Self-pay | Admitting: Cardiovascular Disease

## 2016-05-21 ENCOUNTER — Ambulatory Visit
Admission: RE | Admit: 2016-05-21 | Discharge: 2016-05-21 | Disposition: A | Discharge status: Home / Self Care | Payer: Medicare Other | Source: Ambulatory Visit | Attending: Cardiovascular Disease | Admitting: Cardiovascular Disease

## 2016-05-21 DIAGNOSIS — E785 Hyperlipidemia, unspecified: Secondary | ICD-10-CM | POA: Insufficient documentation

## 2016-05-21 DIAGNOSIS — I10 Essential (primary) hypertension: Secondary | ICD-10-CM | POA: Insufficient documentation

## 2016-05-21 MED ORDER — TECHNETIUM TC 99M TETROFOSMIN IV KIT
32.6200 | PACK | Freq: Once | INTRAVENOUS | Status: AC | PRN
  Administered 2016-05-21: 32.62 via INTRAVENOUS

## 2016-05-21 MED ORDER — TECHNETIUM TC 99M TETROFOSMIN IV KIT
12.8200 | PACK | Freq: Once | INTRAVENOUS | Status: AC | PRN
  Administered 2016-05-21: 12.82 via INTRAVENOUS

## 2016-05-22 LAB — NM MYOCAR MULTI W/SPECT W/WALL MOTION / EF
CHL CUP NUCLEAR SDS: 0
CHL CUP NUCLEAR SRS: 0
CHL CUP NUCLEAR SSS: 0
CSEPED: 6 min
CSEPEW: 7 METS
CSEPHR: 85 %
Exercise duration (sec): 30 s
LV dias vol: 70 mL (ref 62–150)
LV sys vol: 26 mL
Peak HR: 134 {beats}/min
Rest HR: 55 {beats}/min
TID: 0.96

## 2016-05-23 ENCOUNTER — Telehealth: Payer: Self-pay | Admitting: Cardiovascular Disease

## 2016-05-23 NOTE — Telephone Encounter (Signed)
Check results folder   read yesterday Test is normal

## 2016-05-23 NOTE — Telephone Encounter (Signed)
Pt wife is calling to get stress test results. Please call.

## 2016-05-28 ENCOUNTER — Telehealth: Payer: Self-pay | Admitting: Family Medicine

## 2016-05-28 DIAGNOSIS — G4700 Insomnia, unspecified: Secondary | ICD-10-CM

## 2016-05-28 MED ORDER — ZOLPIDEM TARTRATE 10 MG PO TABS: 10.0000 mg | ORAL_TABLET | Freq: Every evening | ORAL | 2 refills | Status: DC | PRN

## 2016-05-28 NOTE — Telephone Encounter (Signed)
Prescription for Ambien is ready for pick-up

## 2016-05-29 NOTE — Telephone Encounter (Signed)
Wife [name]) informed prescription ready for pick up

## 2016-06-25 ENCOUNTER — Ambulatory Visit: Payer: BLUE CROSS/BLUE SHIELD | Admitting: Cardiovascular Disease

## 2016-07-09 ENCOUNTER — Other Ambulatory Visit: Payer: Self-pay | Admitting: Family Medicine

## 2016-07-09 DIAGNOSIS — K219 Gastro-esophageal reflux disease without esophagitis: Secondary | ICD-10-CM

## 2016-07-29 DIAGNOSIS — Z23 Encounter for immunization: Secondary | ICD-10-CM | POA: Diagnosis not present

## 2016-07-31 DIAGNOSIS — H524 Presbyopia: Secondary | ICD-10-CM | POA: Diagnosis not present

## 2016-07-31 DIAGNOSIS — D2212 Melanocytic nevi of left eyelid, including canthus: Secondary | ICD-10-CM | POA: Diagnosis not present

## 2016-08-22 DIAGNOSIS — Z86010 Personal history of colonic polyps: Secondary | ICD-10-CM | POA: Diagnosis not present

## 2016-08-26 ENCOUNTER — Telehealth: Payer: Self-pay | Admitting: Family Medicine

## 2016-08-26 NOTE — Telephone Encounter (Signed)
Please schedule patient for an appointment for medication refills. 

## 2016-08-26 NOTE — Telephone Encounter (Signed)
Pt needs refill on Ambien

## 2016-08-27 NOTE — Telephone Encounter (Signed)
Pt has an appt for next week

## 2016-09-01 DIAGNOSIS — D2212 Melanocytic nevi of left eyelid, including canthus: Secondary | ICD-10-CM | POA: Diagnosis not present

## 2016-09-03 ENCOUNTER — Encounter: Payer: Self-pay | Admitting: Family Medicine

## 2016-09-03 ENCOUNTER — Ambulatory Visit (INDEPENDENT_AMBULATORY_CARE_PROVIDER_SITE_OTHER): Payer: Medicare Other | Admitting: Family Medicine

## 2016-09-03 VITALS — BP 138/72 | HR 89 | Temp 98.6°F | Resp 18 | Ht 66.0 in | Wt 177.0 lb

## 2016-09-03 DIAGNOSIS — R911 Solitary pulmonary nodule: Secondary | ICD-10-CM | POA: Diagnosis not present

## 2016-09-03 DIAGNOSIS — G4700 Insomnia, unspecified: Secondary | ICD-10-CM | POA: Diagnosis not present

## 2016-09-03 DIAGNOSIS — K219 Gastro-esophageal reflux disease without esophagitis: Secondary | ICD-10-CM | POA: Diagnosis not present

## 2016-09-03 MED ORDER — ZOLPIDEM TARTRATE 10 MG PO TABS: 10.0000 mg | ORAL_TABLET | Freq: Every evening | ORAL | 2 refills | Status: DC | PRN

## 2016-09-03 MED ORDER — OMEPRAZOLE 40 MG PO CPDR: 40.0000 mg | DELAYED_RELEASE_CAPSULE | Freq: Every day | ORAL | 1 refills | Status: DC

## 2016-09-03 NOTE — Progress Notes (Signed)
Name: Christian Santos   MRN: [id number]    DOB: Apr 07, 1951   Date:09/03/2016       Progress Note  Subjective  Chief Complaint  Chief Complaint  Patient presents with  . Follow-up    3 mo BP    Insomnia  Primary symptoms: no sleep disturbance, frequent awakening.  Past treatments include medication. Typical bedtime:  10-11 P.M..  How long after going to bed to you fall asleep: 15-30 minutes.    Gastroesophageal Reflux  He reports no abdominal pain, no heartburn or no nausea. This is a chronic problem. The symptoms are aggravated by lying down. He has tried a PPI and a histamine-2 antagonist for the symptoms.     Past Medical History:  Diagnosis Date  . Hyperlipidemia   . Hypertension   . Insomnia     Past Surgical History:  Procedure Laterality Date  . HAND SURGERY      Family History  Problem Relation Age of Onset  . Heart attack Father   . Heart attack Brother   . Heart disease Brother     Social History   Social History  . Marital status: Married    Spouse name: N/A  . Number of children: N/A  . Years of education: N/A   Occupational History  . Not on file.   Social History Main Topics  . Smoking status: Never Smoker  . Smokeless tobacco: Never Used  . Alcohol use 0.0 oz/week  . Drug use: No  . Sexual activity: Yes    Partners: Female   Other Topics Concern  . Not on file   Social History Narrative  . No narrative on file     Current Outpatient Prescriptions:  .  Adalimumab (HUMIRA PEN) 40 MG/0.8ML PNKT, every 14 (fourteen) days. , Disp: , Rfl:  .  amLODipine (NORVASC) 5 MG tablet, Take 5 mg by mouth daily., Disp: , Rfl:  .  aspirin EC 81 MG tablet, Take by mouth., Disp: , Rfl:  .  calcium carbonate (OS-CAL - DOSED IN MG OF ELEMENTAL CALCIUM) 1250 (500 CA) MG tablet, Take by mouth., Disp: , Rfl:  .  fluticasone (FLONASE) 50 MCG/ACT nasal spray, Place 2 sprays into both nostrils daily., Disp: 16 g, Rfl: 6 .  ketoconazole (NIZORAL) 2 %  cream, Apply 1 application topically daily., Disp: , Rfl:  .  LACTOBACILLUS ACID-PECTIN PO, Take by mouth., Disp: , Rfl:  .  magnesium gluconate (MAGONATE) 500 MG tablet, Take 1,000 mg by mouth daily., Disp: , Rfl:  .  Multiple Vitamins-Minerals (MULTIVITAMIN WITH MINERALS) tablet, Take 1 tablet by mouth daily., Disp: , Rfl:  .  mupirocin cream (BACTROBAN) 2 %, Apply topically daily as needed., Disp: 30 g, Rfl: 1 .  naftifine (NAFTIN) 1 % cream, Apply topically., Disp: , Rfl:  .  omeprazole (PRILOSEC) 40 MG capsule, TAKE ONE CAPSULE DAILY, Disp: 90 capsule, Rfl: 1 .  ranitidine (ZANTAC) 75 MG tablet, Take 75 mg by mouth 2 (two) times daily., Disp: , Rfl:  .  rosuvastatin (CRESTOR) 10 MG tablet, Take 1 tablet (10 mg total) by mouth daily., Disp: 30 tablet, Rfl: 6 .  valsartan (DIOVAN) 160 MG tablet, Take 160 mg by mouth daily., Disp: , Rfl:  .  zolpidem (AMBIEN) 10 MG tablet, Take 1 tablet (10 mg total) by mouth at bedtime as needed for sleep., Disp: 30 tablet, Rfl: 2  Allergies  Allergen Reactions  . Alcohol Other (See Comments)  . Lisinopril Other (See Comments)  cough cough  . Other Other (See Comments)    Mycins = chest pain, [place] of [place] = skin test results. Mycins = chest pain, [place] = skin test results. Mycins = chest pain, [place] = skin test results.  Nilsa Nutting Alcohol  [Lanolin] Other (See Comments)  . Clobetasol Rash    Severe blisters Severe blisters Severe blisters     Review of Systems  Gastrointestinal: Negative for abdominal pain, heartburn and nausea.  Psychiatric/Behavioral: Negative for sleep disturbance. The patient has insomnia.       Objective  Vitals:   09/03/16 1544  BP: 138/72  Pulse: 89  Resp: 18  Temp: 98.6 F (37 C)  TempSrc: Oral  SpO2: 96%  Weight: 177 lb (80.3 kg)  Height: 5\' 6"  (1.676 m)    Physical Exam  Constitutional: He is oriented to person, place, and time and well-developed, well-nourished, and in no distress.   HENT:  Head: Normocephalic and atraumatic.  Cardiovascular: Normal rate, regular rhythm and normal heart sounds.   No murmur heard. Pulmonary/Chest: Effort normal and breath sounds normal. He has no wheezes.  Abdominal: Soft. Bowel sounds are normal. There is no tenderness.  Neurological: He is alert and oriented to person, place, and time.  Psychiatric: Mood, memory, affect and judgment normal.  Nursing note and vitals reviewed.     Assessment & Plan  1. Gastro-esophageal reflux disease without esophagitis Stable and responsive to therapy. - omeprazole (PRILOSEC) 40 MG capsule; Take 1 capsule (40 mg total) by mouth daily.  Dispense: 90 capsule; Refill: 1  2. Insomnia, unspecified type  - zolpidem (AMBIEN) 10 MG tablet; Take 1 tablet (10 mg total) by mouth at bedtime as needed for sleep.  Dispense: 30 tablet; Refill: 2  3. Solitary pulmonary nodule on lung CT CT scan report reviewed, repeat in February 2018.   Abryanna Musolino [facility] 09/03/2016 4:00 PM

## 2016-09-17 DIAGNOSIS — L400 Psoriasis vulgaris: Secondary | ICD-10-CM | POA: Diagnosis not present

## 2016-09-17 DIAGNOSIS — B078 Other viral warts: Secondary | ICD-10-CM | POA: Diagnosis not present

## 2016-09-17 DIAGNOSIS — Z79899 Other long term (current) drug therapy: Secondary | ICD-10-CM | POA: Diagnosis not present

## 2016-10-21 DIAGNOSIS — J069 Acute upper respiratory infection, unspecified: Secondary | ICD-10-CM | POA: Diagnosis not present

## 2016-10-27 ENCOUNTER — Encounter: Payer: Self-pay | Admitting: Cardiovascular Disease

## 2016-10-27 DIAGNOSIS — I129 Hypertensive chronic kidney disease with stage 1 through stage 4 chronic kidney disease, or unspecified chronic kidney disease: Secondary | ICD-10-CM | POA: Diagnosis not present

## 2016-10-27 DIAGNOSIS — E875 Hyperkalemia: Secondary | ICD-10-CM | POA: Diagnosis not present

## 2016-10-27 DIAGNOSIS — E785 Hyperlipidemia, unspecified: Secondary | ICD-10-CM | POA: Diagnosis not present

## 2016-10-27 DIAGNOSIS — N183 Chronic kidney disease, stage 3 (moderate): Secondary | ICD-10-CM | POA: Diagnosis not present

## 2016-11-05 ENCOUNTER — Encounter: Payer: Self-pay | Admitting: Family Medicine

## 2016-11-05 ENCOUNTER — Ambulatory Visit (INDEPENDENT_AMBULATORY_CARE_PROVIDER_SITE_OTHER): Payer: Medicare Other | Admitting: Family Medicine

## 2016-11-05 VITALS — BP 110/68 | HR 64 | Temp 97.9°F | Resp 16 | Ht 66.0 in | Wt 171.8 lb

## 2016-11-05 DIAGNOSIS — J0100 Acute maxillary sinusitis, unspecified: Secondary | ICD-10-CM | POA: Insufficient documentation

## 2016-11-05 DIAGNOSIS — Z79899 Other long term (current) drug therapy: Secondary | ICD-10-CM | POA: Diagnosis not present

## 2016-11-05 DIAGNOSIS — E7800 Pure hypercholesterolemia, unspecified: Secondary | ICD-10-CM

## 2016-11-05 DIAGNOSIS — R911 Solitary pulmonary nodule: Secondary | ICD-10-CM

## 2016-11-05 DIAGNOSIS — G4700 Insomnia, unspecified: Secondary | ICD-10-CM | POA: Diagnosis not present

## 2016-11-05 DIAGNOSIS — L400 Psoriasis vulgaris: Secondary | ICD-10-CM | POA: Diagnosis not present

## 2016-11-05 LAB — LIPID PANEL
Cholesterol: 102 mg/dL (ref <200)
HDL: 32 mg/dL — AB (ref >40)
LDL CALC: 43 mg/dL (ref <100)
Total CHOL/HDL Ratio: 3.2 Ratio (ref <5.0)
Triglycerides: 135 mg/dL (ref <150)
VLDL: 27 mg/dL (ref <30)

## 2016-11-05 MED ORDER — AMOXICILLIN-POT CLAVULANATE 875-125 MG PO TABS: 1.0000 | ORAL_TABLET | Freq: Two times a day (BID) | ORAL | 0 refills | Status: DC

## 2016-11-05 MED ORDER — ZOLPIDEM TARTRATE 10 MG PO TABS: 10.0000 mg | ORAL_TABLET | Freq: Every evening | ORAL | 2 refills | Status: DC | PRN

## 2016-11-05 MED ORDER — ROSUVASTATIN CALCIUM 10 MG PO TABS: 10.0000 mg | ORAL_TABLET | Freq: Every day | ORAL | 0 refills | Status: DC

## 2016-11-05 NOTE — Progress Notes (Signed)
Name: Christian Santos   MRN: [id number]    DOB: Apr 16, 1951   Date:11/05/2016       Progress Note  Subjective  Chief Complaint  Chief Complaint  Patient presents with  . Annual Exam    Hyperlipidemia  This is a chronic problem. The problem is controlled. Lipid results: goal LDL < 70,  Pertinent negatives include no chest pain, leg pain, myalgias or shortness of breath. Current antihyperlipidemic treatment includes statins.   Pt. Presents for follow-up on a right lung nodule originally visualized on Cardiac CT in August 2017, it was identified as a 6 mm nodule at right major fissure. Patient does not smoke, no FHx of lung cancer. Will need to repeat Chest CT w/o contrast for surveillance of SPN.  Cough and Congestion: Symptoms originally started last month but then cleared up and came back 2 weeks ago. Include cough with mucus production, hoarseness at night and congestion. No fevers or chills. HE was seen at Urgent Care 2 weeks ago and was prescribed cough tablets and nasal spray, which has helped a little.    Past Medical History:  Diagnosis Date  . Hyperlipidemia   . Hypertension   . Insomnia     Past Surgical History:  Procedure Laterality Date  . HAND SURGERY      Family History  Problem Relation Age of Onset  . Heart attack Father   . Heart attack Brother   . Heart disease Brother     Social History   Social History  . Marital status: Married    Spouse name: N/A  . Number of children: N/A  . Years of education: N/A   Occupational History  . Not on file.   Social History Main Topics  . Smoking status: Never Smoker  . Smokeless tobacco: Never Used  . Alcohol use 0.0 oz/week  . Drug use: No  . Sexual activity: Yes    Partners: Female   Other Topics Concern  . Not on file   Social History Narrative  . No narrative on file     Current Outpatient Prescriptions:  .  Adalimumab (HUMIRA PEN) 40 MG/0.8ML PNKT, every 14 (fourteen) days. , Disp: , Rfl:   .  amLODipine (NORVASC) 5 MG tablet, Take 5 mg by mouth daily., Disp: , Rfl:  .  aspirin EC 81 MG tablet, Take by mouth., Disp: , Rfl:  .  calcium carbonate (OS-CAL - DOSED IN MG OF ELEMENTAL CALCIUM) 1250 (500 CA) MG tablet, Take by mouth., Disp: , Rfl:  .  fluticasone (FLONASE) 50 MCG/ACT nasal spray, Place 2 sprays into both nostrils daily., Disp: 16 g, Rfl: 6 .  ketoconazole (NIZORAL) 2 % cream, Apply 1 application topically daily., Disp: , Rfl:  .  LACTOBACILLUS ACID-PECTIN PO, Take by mouth., Disp: , Rfl:  .  magnesium gluconate (MAGONATE) 500 MG tablet, Take 1,000 mg by mouth daily., Disp: , Rfl:  .  Multiple Vitamins-Minerals (MULTIVITAMIN WITH MINERALS) tablet, Take 1 tablet by mouth daily., Disp: , Rfl:  .  mupirocin cream (BACTROBAN) 2 %, Apply topically daily as needed., Disp: 30 g, Rfl: 1 .  naftifine (NAFTIN) 1 % cream, Apply topically., Disp: , Rfl:  .  omeprazole (PRILOSEC) 40 MG capsule, Take 1 capsule (40 mg total) by mouth daily., Disp: 90 capsule, Rfl: 1 .  ranitidine (ZANTAC) 75 MG tablet, Take 75 mg by mouth 2 (two) times daily., Disp: , Rfl:  .  valsartan (DIOVAN) 160 MG tablet, Take 160 mg by  mouth daily., Disp: , Rfl:  .  zolpidem (AMBIEN) 10 MG tablet, Take 1 tablet (10 mg total) by mouth at bedtime as needed for sleep., Disp: 30 tablet, Rfl: 2 .  rosuvastatin (CRESTOR) 10 MG tablet, Take 1 tablet (10 mg total) by mouth daily., Disp: 30 tablet, Rfl: 6  Allergies  Allergen Reactions  . Alcohol Other (See Comments)  . Lisinopril Other (See Comments)    cough cough  . Other Other (See Comments)    Mycins = chest pain, [place] of [place] = skin test results. Mycins = chest pain, [place] = skin test results. Mycins = chest pain, [place] = skin test results.  Nilsa Nutting Alcohol  [Lanolin] Other (See Comments)  . Clobetasol Rash    Severe blisters Severe blisters Severe blisters     Review of Systems  Constitutional: Negative for chills and fever.   Respiratory: Positive for cough and sputum production. Negative for hemoptysis, shortness of breath and wheezing.   Cardiovascular: Negative for chest pain.  Gastrointestinal: Negative for abdominal pain.  Musculoskeletal: Negative for myalgias.    Objective  Vitals:   11/05/16 0849  BP: 110/68  Pulse: 64  Resp: 16  Temp: 97.9 F (36.6 C)  TempSrc: Oral  SpO2: 96%  Weight: 171 lb 12.8 oz (77.9 kg)  Height: 5\' 6"  (1.676 m)    Physical Exam  Constitutional: He is oriented to person, place, and time and well-developed, well-nourished, and in no distress.  HENT:  Head: Normocephalic and atraumatic.  Right Ear: Tympanic membrane and ear canal normal. No drainage or swelling.  Left Ear: Tympanic membrane and ear canal normal. No drainage or swelling.  Nose: Right sinus exhibits no maxillary sinus tenderness. Left sinus exhibits maxillary sinus tenderness.  Mouth/Throat: Posterior oropharyngeal erythema present. No oropharyngeal exudate.  Nasal turbinates hypertrophied and inflamed  Cardiovascular: Normal rate, regular rhythm and normal heart sounds.   No murmur heard. Pulmonary/Chest: Effort normal and breath sounds normal. He has no wheezes.  Neurological: He is alert and oriented to person, place, and time.  Psychiatric: Mood, memory, affect and judgment normal.  Nursing note and vitals reviewed.     Assessment & Plan  1. Pure hypercholesterolemia LDL elevated above goal of less than 70 mg/dL, continue on Crestor and recheck FLP - Lipid Profile - rosuvastatin (CRESTOR) 10 MG tablet; Take 1 tablet (10 mg total) by mouth daily.  Dispense: 90 tablet; Refill: 0  2. Solitary pulmonary nodule on lung CT Repeat noncontrast chest CT for evaluation of solitary pulmonary nodule - CT CHEST NODULE FOLLOW UP LOW DOSE W/O; Future  3. Acute non-recurrent maxillary sinusitis Reviewed origin care notes, because of persistent congestion and drainage, we'll start on antibiotic  therapy - amoxicillin-clavulanate (AUGMENTIN) 875-125 MG tablet; Take 1 tablet by mouth 2 (two) times daily.  Dispense: 20 tablet; Refill: 0  4. Insomnia, unspecified type  - zolpidem (AMBIEN) 10 MG tablet; Take 1 tablet (10 mg total) by mouth at bedtime as needed for sleep.  Dispense: 30 tablet; Refill: 2    Yuna Pizzolato [facility] 11/05/2016 9:03 AM

## 2016-11-13 ENCOUNTER — Ambulatory Visit
Admission: RE | Admit: 2016-11-13 | Discharge: 2016-11-13 | Disposition: A | Discharge status: Home / Self Care | Payer: Medicare Other | Source: Ambulatory Visit | Attending: Family Medicine | Admitting: Family Medicine

## 2016-11-13 DIAGNOSIS — R911 Solitary pulmonary nodule: Secondary | ICD-10-CM | POA: Diagnosis not present

## 2016-11-13 DIAGNOSIS — I2510 Atherosclerotic heart disease of native coronary artery without angina pectoris: Secondary | ICD-10-CM | POA: Diagnosis not present

## 2016-11-25 DIAGNOSIS — D120 Benign neoplasm of cecum: Secondary | ICD-10-CM | POA: Diagnosis not present

## 2016-11-25 DIAGNOSIS — D122 Benign neoplasm of ascending colon: Secondary | ICD-10-CM | POA: Diagnosis not present

## 2016-11-25 DIAGNOSIS — D126 Benign neoplasm of colon, unspecified: Secondary | ICD-10-CM | POA: Diagnosis not present

## 2016-11-25 DIAGNOSIS — D123 Benign neoplasm of transverse colon: Secondary | ICD-10-CM | POA: Diagnosis not present

## 2016-11-25 DIAGNOSIS — K648 Other hemorrhoids: Secondary | ICD-10-CM | POA: Diagnosis not present

## 2016-11-25 DIAGNOSIS — Z86010 Personal history of colonic polyps: Secondary | ICD-10-CM | POA: Diagnosis not present

## 2016-11-25 LAB — HM COLONOSCOPY

## 2016-12-05 ENCOUNTER — Other Ambulatory Visit: Payer: Self-pay | Admitting: Cardiovascular Disease

## 2016-12-05 DIAGNOSIS — E7800 Pure hypercholesterolemia, unspecified: Secondary | ICD-10-CM

## 2016-12-05 MED ORDER — ROSUVASTATIN CALCIUM 10 MG PO TABS: 10.0000 mg | ORAL_TABLET | Freq: Every day | ORAL | 3 refills | Status: DC

## 2016-12-08 ENCOUNTER — Other Ambulatory Visit: Payer: Self-pay | Admitting: Cardiovascular Disease

## 2016-12-08 DIAGNOSIS — E785 Hyperlipidemia, unspecified: Secondary | ICD-10-CM

## 2016-12-08 DIAGNOSIS — I10 Essential (primary) hypertension: Secondary | ICD-10-CM

## 2016-12-25 DIAGNOSIS — L820 Inflamed seborrheic keratosis: Secondary | ICD-10-CM | POA: Diagnosis not present

## 2016-12-25 DIAGNOSIS — B078 Other viral warts: Secondary | ICD-10-CM | POA: Diagnosis not present

## 2017-02-03 ENCOUNTER — Encounter: Payer: Self-pay | Admitting: Family Medicine

## 2017-02-03 ENCOUNTER — Ambulatory Visit (INDEPENDENT_AMBULATORY_CARE_PROVIDER_SITE_OTHER): Payer: Medicare Other | Admitting: Family Medicine

## 2017-02-03 VITALS — BP 112/66 | HR 62 | Temp 98.2°F | Resp 16 | Ht 66.0 in | Wt 169.0 lb

## 2017-02-03 DIAGNOSIS — I10 Essential (primary) hypertension: Secondary | ICD-10-CM

## 2017-02-03 DIAGNOSIS — G4700 Insomnia, unspecified: Secondary | ICD-10-CM

## 2017-02-03 DIAGNOSIS — E7800 Pure hypercholesterolemia, unspecified: Secondary | ICD-10-CM

## 2017-02-03 MED ORDER — AMLODIPINE BESYLATE 5 MG PO TABS: 5.0000 mg | ORAL_TABLET | Freq: Every day | ORAL | 0 refills | Status: DC

## 2017-02-03 MED ORDER — ZOLPIDEM TARTRATE 10 MG PO TABS: 10.0000 mg | ORAL_TABLET | Freq: Every evening | ORAL | 2 refills | Status: DC | PRN

## 2017-02-03 NOTE — Progress Notes (Signed)
Name: Christian Santos   MRN: [id number]    DOB: 1951/08/14   Date:02/03/2017       Progress Note  Subjective  Chief Complaint  Chief Complaint  Patient presents with  . Follow-up    3 mo  . Medication Refill    Hyperlipidemia  This is a chronic problem. The problem is controlled. Lipid results: goal LDL < 70,  Pertinent negatives include no chest pain, leg pain, myalgias or shortness of breath. Current antihyperlipidemic treatment includes statins.  Hypertension  This is a chronic problem. The problem is unchanged. The problem is controlled. Pertinent negatives include no blurred vision, chest pain, headaches or shortness of breath. Past treatments include calcium channel blockers. There is no history of kidney disease, CAD/MI or CVA.  Insomnia  Primary symptoms: frequent awakening, premature morning awakening.  Past treatments include medication. Typical bedtime:  10-11 P.M..  How long after going to bed to you fall asleep: 15-30 minutes.       Past Medical History:  Diagnosis Date  . Hyperlipidemia   . Hypertension   . Insomnia     Past Surgical History:  Procedure Laterality Date  . HAND SURGERY      Family History  Problem Relation Age of Onset  . Heart attack Father   . Heart attack Brother   . Heart disease Brother     Social History   Social History  . Marital status: Married    Spouse name: N/A  . Number of children: N/A  . Years of education: N/A   Occupational History  . Not on file.   Social History Main Topics  . Smoking status: Never Smoker  . Smokeless tobacco: Never Used  . Alcohol use 0.0 oz/week  . Drug use: No  . Sexual activity: Yes    Partners: Female   Other Topics Concern  . Not on file   Social History Narrative  . No narrative on file     Current Outpatient Prescriptions:  .  amLODipine (NORVASC) 5 MG tablet, Take 5 mg by mouth daily., Disp: , Rfl:  .  aspirin EC 81 MG tablet, Take by mouth., Disp: , Rfl:  .  calcium  carbonate (OS-CAL - DOSED IN MG OF ELEMENTAL CALCIUM) 1250 (500 CA) MG tablet, Take by mouth., Disp: , Rfl:  .  fluticasone (FLONASE) 50 MCG/ACT nasal spray, Place 2 sprays into both nostrils daily., Disp: 16 g, Rfl: 6 .  ketoconazole (NIZORAL) 2 % cream, Apply 1 application topically daily., Disp: , Rfl:  .  LACTOBACILLUS ACID-PECTIN PO, Take by mouth., Disp: , Rfl:  .  magnesium gluconate (MAGONATE) 500 MG tablet, Take 1,000 mg by mouth daily., Disp: , Rfl:  .  Multiple Vitamins-Minerals (MULTIVITAMIN WITH MINERALS) tablet, Take 1 tablet by mouth daily., Disp: , Rfl:  .  mupirocin cream (BACTROBAN) 2 %, Apply topically daily as needed., Disp: 30 g, Rfl: 1 .  naftifine (NAFTIN) 1 % cream, Apply topically., Disp: , Rfl:  .  omeprazole (PRILOSEC) 40 MG capsule, Take 1 capsule (40 mg total) by mouth daily., Disp: 90 capsule, Rfl: 1 .  ranitidine (ZANTAC) 75 MG tablet, Take 75 mg by mouth 2 (two) times daily., Disp: , Rfl:  .  rosuvastatin (CRESTOR) 10 MG tablet, Take 1 tablet (10 mg total) by mouth daily., Disp: 90 tablet, Rfl: 3 .  rosuvastatin (CRESTOR) 10 MG tablet, TAKE 1 TABLET EVERY DAY, Disp: 30 tablet, Rfl: 3 .  valsartan (DIOVAN) 160 MG tablet, Take  160 mg by mouth daily., Disp: , Rfl:  .  zolpidem (AMBIEN) 10 MG tablet, Take 1 tablet (10 mg total) by mouth at bedtime as needed for sleep., Disp: 30 tablet, Rfl: 2 .  Adalimumab (HUMIRA PEN) 40 MG/0.8ML PNKT, every 14 (fourteen) days. , Disp: , Rfl:  .  amoxicillin-clavulanate (AUGMENTIN) 875-125 MG tablet, Take 1 tablet by mouth 2 (two) times daily. (Patient not taking: Reported on 02/03/2017), Disp: 20 tablet, Rfl: 0  Allergies  Allergen Reactions  . Alcohol Other (See Comments)  . Lisinopril Other (See Comments)    cough cough  . Other Other (See Comments)    Mycins = chest pain, [place] of [place] = skin test results. Mycins = chest pain, [place] of [place] = skin test results. Mycins = chest pain, [place] of [place] = skin test results.  Nilsa Nutting  Alcohol  [Lanolin] Other (See Comments)  . Clobetasol Rash    Severe blisters Severe blisters Severe blisters     Review of Systems  Eyes: Negative for blurred vision.  Respiratory: Negative for shortness of breath.   Cardiovascular: Negative for chest pain.  Musculoskeletal: Negative for myalgias.  Neurological: Negative for headaches.  Psychiatric/Behavioral: The patient has insomnia.       Objective  Vitals:   02/03/17 0822  BP: 112/66  Pulse: 62  Resp: 16  Temp: 98.2 F (36.8 C)  TempSrc: Oral  SpO2: 96%  Weight: 169 lb (76.7 kg)  Height: 5\' 6"  (1.676 m)    Physical Exam  Constitutional: He is oriented to person, place, and time and well-developed, well-nourished, and in no distress.  HENT:  Head: Normocephalic and atraumatic.  Cardiovascular: Normal rate, regular rhythm and normal heart sounds.   No murmur heard. Pulmonary/Chest: Effort normal and breath sounds normal. He has no wheezes.  Abdominal: Soft. Bowel sounds are normal. There is no tenderness.  Neurological: He is alert and oriented to person, place, and time.  Psychiatric: Mood, memory, affect and judgment normal.  Nursing note and vitals reviewed.    Recent Results (from the past 2160 hour(s))  Lipid Profile     Status: Abnormal   Collection Time: 11/05/16 10:22 AM  Result Value Ref Range   Cholesterol 102 <200 mg/dL   Triglycerides 135 <150 mg/dL   HDL 32 (L) >40 mg/dL   Total CHOL/HDL Ratio 3.2 <5.0 Ratio   VLDL 27 <30 mg/dL   LDL Cholesterol 43 <100 mg/dL     Assessment & Plan  1. Insomnia, unspecified type Stable, needs Ambien every night to stay asleep - zolpidem (AMBIEN) 10 MG tablet; Take 1 tablet (10 mg total) by mouth at bedtime as needed for sleep.  Dispense: 30 tablet; Refill: 2  2. Pure hypercholesterolemia Obtain FLP, continue on statin - Lipid panel  3. Essential hypertension BP stable on present antihypertensive therapy - amLODipine (NORVASC) 5 MG tablet; Take 1  tablet (5 mg total) by mouth daily.  Dispense: 90 tablet; Refill: 0   Tegan Burnside [facility] 02/03/2017 8:31 AM

## 2017-02-25 ENCOUNTER — Ambulatory Visit (INDEPENDENT_AMBULATORY_CARE_PROVIDER_SITE_OTHER): Payer: Medicare Other | Admitting: Family Medicine

## 2017-02-25 ENCOUNTER — Other Ambulatory Visit: Payer: Self-pay | Admitting: Family Medicine

## 2017-02-25 ENCOUNTER — Encounter: Payer: Self-pay | Admitting: Family Medicine

## 2017-02-25 DIAGNOSIS — W57XXXA Bitten or stung by nonvenomous insect and other nonvenomous arthropods, initial encounter: Secondary | ICD-10-CM | POA: Diagnosis not present

## 2017-02-25 DIAGNOSIS — S30860A Insect bite (nonvenomous) of lower back and pelvis, initial encounter: Secondary | ICD-10-CM | POA: Diagnosis not present

## 2017-02-25 NOTE — Progress Notes (Signed)
Name: Christian Santos   MRN: [id number]    DOB: 1951-01-09   Date:02/25/2017       Progress Note  Subjective  Chief Complaint  Chief Complaint  Patient presents with  . Acute Visit    Tick bite    HPI  Tick Bite: Patient presents for follow up of a reported tick bite on his upper back, first noted almost 2 weeks ago, his wife tried to remove the tick and was able to remove most of it but he feels that their may have been some tick body parts left at the site. He has no fevers, chills, myalgias, or headaches but would like to be checked for Lyme's disease.    Past Medical History:  Diagnosis Date  . Hyperlipidemia   . Hypertension   . Insomnia     Past Surgical History:  Procedure Laterality Date  . HAND SURGERY      Family History  Problem Relation Age of Onset  . Heart attack Father   . Heart attack Brother   . Heart disease Brother     Social History   Social History  . Marital status: Married    Spouse name: N/A  . Number of children: N/A  . Years of education: N/A   Occupational History  . Not on file.   Social History Main Topics  . Smoking status: Never Smoker  . Smokeless tobacco: Never Used  . Alcohol use 0.0 oz/week  . Drug use: No  . Sexual activity: Yes    Partners: Female   Other Topics Concern  . Not on file   Social History Narrative  . No narrative on file     Current Outpatient Prescriptions:  .  Adalimumab (HUMIRA PEN) 40 MG/0.8ML PNKT, every 14 (fourteen) days. , Disp: , Rfl:  .  amLODipine (NORVASC) 5 MG tablet, Take 1 tablet (5 mg total) by mouth daily., Disp: 90 tablet, Rfl: 0 .  aspirin EC 81 MG tablet, Take by mouth., Disp: , Rfl:  .  calcium carbonate (OS-CAL - DOSED IN MG OF ELEMENTAL CALCIUM) 1250 (500 CA) MG tablet, Take by mouth., Disp: , Rfl:  .  fluticasone (FLONASE) 50 MCG/ACT nasal spray, Place 2 sprays into both nostrils daily., Disp: 16 g, Rfl: 6 .  ketoconazole (NIZORAL) 2 % cream, Apply 1 application  topically daily., Disp: , Rfl:  .  LACTOBACILLUS ACID-PECTIN PO, Take by mouth., Disp: , Rfl:  .  magnesium gluconate (MAGONATE) 500 MG tablet, Take 1,000 mg by mouth daily., Disp: , Rfl:  .  Multiple Vitamins-Minerals (MULTIVITAMIN WITH MINERALS) tablet, Take 1 tablet by mouth daily., Disp: , Rfl:  .  mupirocin cream (BACTROBAN) 2 %, Apply topically daily as needed., Disp: 30 g, Rfl: 1 .  naftifine (NAFTIN) 1 % cream, Apply topically., Disp: , Rfl:  .  omeprazole (PRILOSEC) 40 MG capsule, Take 1 capsule (40 mg total) by mouth daily., Disp: 90 capsule, Rfl: 1 .  ranitidine (ZANTAC) 75 MG tablet, Take 75 mg by mouth 2 (two) times daily., Disp: , Rfl:  .  rosuvastatin (CRESTOR) 10 MG tablet, Take 1 tablet (10 mg total) by mouth daily., Disp: 90 tablet, Rfl: 3 .  rosuvastatin (CRESTOR) 10 MG tablet, TAKE 1 TABLET EVERY DAY, Disp: 30 tablet, Rfl: 3 .  valsartan (DIOVAN) 160 MG tablet, Take 160 mg by mouth daily., Disp: , Rfl:  .  zolpidem (AMBIEN) 10 MG tablet, Take 1 tablet (10 mg total) by mouth at bedtime as  needed for sleep., Disp: 30 tablet, Rfl: 2  Allergies  Allergen Reactions  . Alcohol Other (See Comments)  . Lisinopril Other (See Comments)    cough cough  . Other Other (See Comments)    Mycins = chest pain, [place] of [place] = skin test results. Mycins = chest pain, [place] of [place] = skin test results. Mycins = chest pain, [place] of [place] = skin test results.  Nilsa Nutting Alcohol  [Lanolin] Other (See Comments)  . Clobetasol Rash    Severe blisters Severe blisters Severe blisters     ROS  Please see history of present illness for complete discussion of ROS  Objective  Vitals:   02/25/17 1518  BP: 112/76  Pulse: 62  Resp: 16  Temp: 98.1 F (36.7 C)  TempSrc: Oral  Weight: 168 lb 1.6 oz (76.2 kg)  Height: 5\' 6"  (1.676 m)    Physical Exam  Constitutional: He is oriented to person, place, and time and well-developed, well-nourished, and in no distress.  HENT:  Head:  Normocephalic and atraumatic.  Cardiovascular: Normal rate, regular rhythm and normal heart sounds.   No murmur heard. Pulmonary/Chest: Effort normal and breath sounds normal. He has no wheezes.  Neurological: He is alert and oriented to person, place, and time.  Skin: Lesion noted.     Raised erythematous mildly tender to palpation dome shaped lesion on the left upper back, no visible tick or body parts,   Psychiatric: Mood, memory, affect and judgment normal.  Nursing note and vitals reviewed.      Assessment & Plan  1. Tick bite of back, initial encounter Asymptomatic, obtained initial serologies for tickborne illnesses, explained that if he starts having symptoms such as fevers, fatigue, chills, malaise, arthralgias, we'll consider antimicrobial therapy - HGE(IgG/M)+LymeAb(IgM)+RkyIgM   Shawnya Mayor [facility] 02/25/2017 3:25 PM

## 2017-02-27 ENCOUNTER — Other Ambulatory Visit: Payer: Self-pay | Admitting: Family Medicine

## 2017-02-27 DIAGNOSIS — E7800 Pure hypercholesterolemia, unspecified: Secondary | ICD-10-CM | POA: Diagnosis not present

## 2017-02-27 LAB — LIPID PANEL
CHOL/HDL RATIO: 2.9 ratio (ref <5.0)
CHOLESTEROL: 108 mg/dL (ref <200)
HDL: 37 mg/dL — AB (ref >40)
LDL CALC: 53 mg/dL (ref <100)
TRIGLYCERIDES: 91 mg/dL (ref <150)
VLDL: 18 mg/dL (ref <30)

## 2017-02-27 LAB — HGE(IGG/M)+LYMEAB(IGM)+RKYIGM
HGE IgG Titer: NEGATIVE
HGE IgM Titer: NEGATIVE
LYME DISEASE AB, QUANT, IGM: <0.8 index (ref 0.00–0.79)
RMSF IGM: 0.25 {index} (ref 0.00–0.89)

## 2017-04-01 DIAGNOSIS — L400 Psoriasis vulgaris: Secondary | ICD-10-CM | POA: Diagnosis not present

## 2017-04-01 DIAGNOSIS — L4052 Psoriatic arthritis mutilans: Secondary | ICD-10-CM | POA: Diagnosis not present

## 2017-04-16 ENCOUNTER — Encounter: Payer: Medicare Other | Admitting: Family Medicine

## 2017-04-20 DIAGNOSIS — I10 Essential (primary) hypertension: Secondary | ICD-10-CM | POA: Diagnosis not present

## 2017-04-20 DIAGNOSIS — E875 Hyperkalemia: Secondary | ICD-10-CM | POA: Diagnosis not present

## 2017-05-05 ENCOUNTER — Ambulatory Visit (INDEPENDENT_AMBULATORY_CARE_PROVIDER_SITE_OTHER): Payer: Medicare Other | Admitting: Family Medicine

## 2017-05-05 ENCOUNTER — Encounter: Payer: Self-pay | Admitting: Family Medicine

## 2017-05-05 VITALS — BP 118/76 | HR 67 | Temp 97.6°F | Resp 16 | Ht 66.0 in | Wt 165.6 lb

## 2017-05-05 DIAGNOSIS — Z1159 Encounter for screening for other viral diseases: Secondary | ICD-10-CM | POA: Diagnosis not present

## 2017-05-05 DIAGNOSIS — Z125 Encounter for screening for malignant neoplasm of prostate: Secondary | ICD-10-CM | POA: Diagnosis not present

## 2017-05-05 DIAGNOSIS — Z0000 Encounter for general adult medical examination without abnormal findings: Secondary | ICD-10-CM

## 2017-05-05 DIAGNOSIS — E559 Vitamin D deficiency, unspecified: Secondary | ICD-10-CM | POA: Diagnosis not present

## 2017-05-05 DIAGNOSIS — I10 Essential (primary) hypertension: Secondary | ICD-10-CM | POA: Diagnosis not present

## 2017-05-05 LAB — CBC WITH DIFFERENTIAL/PLATELET
BASOS PCT: 0 %
Basophils Absolute: 0 cells/uL (ref 0–200)
EOS ABS: 63 {cells}/uL (ref 15–500)
Eosinophils Relative: 1 %
HCT: 44.8 % (ref 38.5–50.0)
HEMOGLOBIN: 15.3 g/dL (ref 13.2–17.1)
LYMPHS ABS: 1386 {cells}/uL (ref 850–3900)
LYMPHS PCT: 22 %
MCH: 31.9 pg (ref 27.0–33.0)
MCHC: 34.2 g/dL (ref 32.0–36.0)
MCV: 93.5 fL (ref 80.0–100.0)
MONO ABS: 441 {cells}/uL (ref 200–950)
MPV: 9.7 fL (ref 7.5–12.5)
Monocytes Relative: 7 %
Neutro Abs: 4410 cells/uL (ref 1500–7800)
Neutrophils Relative %: 70 %
Platelets: 306 10*3/uL (ref 140–400)
RBC: 4.79 MIL/uL (ref 4.20–5.80)
RDW: 12.8 % (ref 11.0–15.0)
WBC: 6.3 10*3/uL (ref 3.8–10.8)

## 2017-05-05 LAB — HEPATITIS C ANTIBODY: HCV AB: NONREACTIVE

## 2017-05-05 LAB — TSH: TSH: 1.51 m[IU]/L (ref 0.40–4.50)

## 2017-05-05 NOTE — Progress Notes (Signed)
Name: Christian Santos   MRN: [id number]    DOB: 1951/07/01   Date:05/05/2017       Progress Note  Subjective  Chief Complaint  Chief Complaint  Patient presents with  . Annual Exam    AWV    HPI  Pt. presents for Complete Physical Exam. Colonosocpy was in January 2018. He is due for prostate cancer screening.    Past Medical History:  Diagnosis Date  . Hyperlipidemia   . Hypertension   . Insomnia     Past Surgical History:  Procedure Laterality Date  . HAND SURGERY      Family History  Problem Relation Age of Onset  . Heart attack Father   . Heart attack Brother   . Heart disease Brother     Social History   Social History  . Marital status: Married    Spouse name: N/A  . Number of children: N/A  . Years of education: N/A   Occupational History  . Not on file.   Social History Main Topics  . Smoking status: Never Smoker  . Smokeless tobacco: Never Used  . Alcohol use 0.0 oz/week  . Drug use: No  . Sexual activity: Yes    Partners: Female   Other Topics Concern  . Not on file   Social History Narrative  . No narrative on file     Current Outpatient Prescriptions:  .  Adalimumab (HUMIRA PEN) 40 MG/0.8ML PNKT, every 14 (fourteen) days. , Disp: , Rfl:  .  amLODipine (NORVASC) 5 MG tablet, Take 1 tablet (5 mg total) by mouth daily., Disp: 90 tablet, Rfl: 0 .  aspirin EC 81 MG tablet, Take by mouth., Disp: , Rfl:  .  calcium carbonate (OS-CAL - DOSED IN MG OF ELEMENTAL CALCIUM) 1250 (500 CA) MG tablet, Take by mouth., Disp: , Rfl:  .  fluticasone (FLONASE) 50 MCG/ACT nasal spray, Place 2 sprays into both nostrils daily., Disp: 16 g, Rfl: 6 .  ketoconazole (NIZORAL) 2 % cream, Apply 1 application topically daily., Disp: , Rfl:  .  LACTOBACILLUS ACID-PECTIN PO, Take by mouth., Disp: , Rfl:  .  magnesium gluconate (MAGONATE) 500 MG tablet, Take 1,000 mg by mouth daily., Disp: , Rfl:  .  Multiple Vitamins-Minerals (MULTIVITAMIN WITH MINERALS) tablet,  Take 1 tablet by mouth daily., Disp: , Rfl:  .  mupirocin cream (BACTROBAN) 2 %, Apply topically daily as needed., Disp: 30 g, Rfl: 1 .  naftifine (NAFTIN) 1 % cream, Apply topically., Disp: , Rfl:  .  omeprazole (PRILOSEC) 40 MG capsule, Take 1 capsule (40 mg total) by mouth daily., Disp: 90 capsule, Rfl: 1 .  ranitidine (ZANTAC) 75 MG tablet, Take 75 mg by mouth 2 (two) times daily., Disp: , Rfl:  .  rosuvastatin (CRESTOR) 10 MG tablet, TAKE 1 TABLET EVERY DAY, Disp: 30 tablet, Rfl: 3 .  zolpidem (AMBIEN) 10 MG tablet, Take 1 tablet (10 mg total) by mouth at bedtime as needed for sleep., Disp: 30 tablet, Rfl: 2 .  rosuvastatin (CRESTOR) 10 MG tablet, Take 1 tablet (10 mg total) by mouth daily., Disp: 90 tablet, Rfl: 3 .  valsartan (DIOVAN) 160 MG tablet, Take 160 mg by mouth daily., Disp: , Rfl:   Allergies  Allergen Reactions  . Alcohol Other (See Comments)  . Lisinopril Other (See Comments)    cough cough  . Other Other (See Comments)    Mycins = chest pain, [place] = skin test results. Mycins = chest  pain, [place] = skin test results. Mycins = chest pain, [place] = skin test results.  Nilsa Nutting Alcohol  [Lanolin] Other (See Comments)  . Clobetasol Rash    Severe blisters Severe blisters Severe blisters     Review of Systems  Constitutional: Positive for malaise/fatigue. Negative for chills and fever.  HENT: Negative for congestion, ear pain and sore throat.   Eyes: Negative for blurred vision and double vision.  Respiratory: Negative for cough and shortness of breath.   Cardiovascular: Negative for chest pain, palpitations and leg swelling.  Gastrointestinal: Negative for blood in stool, constipation, diarrhea, nausea and vomiting.  Genitourinary: Negative for dysuria and hematuria.  Musculoskeletal: Negative for back pain and neck pain.  Neurological: Negative for dizziness and headaches (occasional headaches).  Psychiatric/Behavioral: Negative for depression.  The patient is not nervous/anxious and does not have insomnia.      Objective  Vitals:   05/05/17 1359  BP: 118/76  Pulse: 67  Resp: 16  Temp: 97.6 F (36.4 C)  TempSrc: Oral  SpO2: 96%  Weight: 165 lb 9.6 oz (75.1 kg)  Height: 5\' 6"  (1.676 m)    Physical Exam  Constitutional: He is oriented to person, place, and time and well-developed, well-nourished, and in no distress.  HENT:  Head: Normocephalic and atraumatic.  Right Ear: External ear normal.  Left Ear: External ear normal.  Eyes: Pupils are equal, round, and reactive to light. Conjunctivae are normal.  Neck: Neck supple. No thyromegaly present.  Cardiovascular: Normal rate, regular rhythm and normal heart sounds.   No murmur heard. Pulmonary/Chest: Effort normal and breath sounds normal. He has no wheezes.  Abdominal: Soft. Bowel sounds are normal. There is no tenderness.  Genitourinary: Rectum normal.  Musculoskeletal: He exhibits no edema.  Neurological: He is alert and oriented to person, place, and time.  Skin: Skin is warm and dry.  Psychiatric: Mood, memory, affect and judgment normal.  Nursing note and vitals reviewed.      Assessment & Plan  1. Encounter for annual physical exam Obtain age-appropriate laboratory screenings - CBC with Differential/Platelet - TSH - VITAMIN D 25 Hydroxy (Vit-D Deficiency, Fractures)  2. Screening for prostate cancer  - PSA  3. Need for hepatitis C screening test  - Hepatitis C antibody   Anneliese Leblond Asad A. [facility] 05/05/2017 2:27 PM

## 2017-05-06 LAB — PSA: PSA: 2.3 ng/mL (ref <=4.0)

## 2017-05-06 LAB — VITAMIN D 25 HYDROXY (VIT D DEFICIENCY, FRACTURES): VIT D 25 HYDROXY: 43 ng/mL (ref 30–100)

## 2017-05-17 ENCOUNTER — Encounter: Payer: Self-pay | Admitting: Family Medicine

## 2017-05-18 ENCOUNTER — Other Ambulatory Visit: Payer: Self-pay | Admitting: Family Medicine

## 2017-05-18 DIAGNOSIS — G4700 Insomnia, unspecified: Secondary | ICD-10-CM

## 2017-05-29 ENCOUNTER — Encounter: Payer: Self-pay | Admitting: Family Medicine

## 2017-06-02 ENCOUNTER — Encounter: Payer: Self-pay | Admitting: Family Medicine

## 2017-06-02 ENCOUNTER — Ambulatory Visit (INDEPENDENT_AMBULATORY_CARE_PROVIDER_SITE_OTHER): Payer: Medicare Other | Admitting: Family Medicine

## 2017-06-02 VITALS — BP 118/67 | HR 84 | Temp 97.8°F | Resp 16 | Ht 66.0 in | Wt 168.7 lb

## 2017-06-02 DIAGNOSIS — M791 Myalgia, unspecified site: Secondary | ICD-10-CM

## 2017-06-02 LAB — CBC WITH DIFFERENTIAL/PLATELET
BASOS ABS: 0 {cells}/uL (ref 0–200)
Basophils Relative: 0 %
EOS ABS: 64 {cells}/uL (ref 15–500)
Eosinophils Relative: 1 %
HCT: 44.8 % (ref 38.5–50.0)
Hemoglobin: 15.8 g/dL (ref 13.2–17.1)
LYMPHS PCT: 20 %
Lymphs Abs: 1280 cells/uL (ref 850–3900)
MCH: 32.2 pg (ref 27.0–33.0)
MCHC: 35.3 g/dL (ref 32.0–36.0)
MCV: 91.4 fL (ref 80.0–100.0)
MONOS PCT: 7 %
MPV: 9.6 fL (ref 7.5–12.5)
Monocytes Absolute: 448 cells/uL (ref 200–950)
Neutro Abs: 4608 cells/uL (ref 1500–7800)
Neutrophils Relative %: 72 %
PLATELETS: 294 10*3/uL (ref 140–400)
RBC: 4.9 MIL/uL (ref 4.20–5.80)
RDW: 12.5 % (ref 11.0–15.0)
WBC: 6.4 10*3/uL (ref 3.8–10.8)

## 2017-06-02 NOTE — Progress Notes (Signed)
Name: Christian Santos   MRN: [id number]    DOB: 04/14/1951   Date:06/02/2017       Progress Note  Subjective  Chief Complaint  Chief Complaint  Patient presents with  . Arm Pain  . Leg Pain    HPI  Pt. Presents for evaluation of muscle cramps and fatigue, has always felt tired (works 10-12 hrs a day) but now feeling muscle soreness in his calf and proximal arms. It feels as if he has exercised a lot when he hasn't. He is normally active and works a full day, now feeling worn out. He denies any fevers, chills, cough, fatigue, dizziness etc. He has been taking statin for over a year, prescribed by cardiology.    Past Medical History:  Diagnosis Date  . Hyperlipidemia   . Hypertension   . Insomnia     Past Surgical History:  Procedure Laterality Date  . HAND SURGERY      Family History  Problem Relation Age of Onset  . Heart attack Father   . Heart attack Brother   . Heart disease Brother     Social History   Social History  . Marital status: Married    Spouse name: N/A  . Number of children: N/A  . Years of education: N/A   Occupational History  . Not on file.   Social History Main Topics  . Smoking status: Never Smoker  . Smokeless tobacco: Never Used  . Alcohol use 0.0 oz/week  . Drug use: No  . Sexual activity: Yes    Partners: Female   Other Topics Concern  . Not on file   Social History Narrative  . No narrative on file     Current Outpatient Prescriptions:  .  Adalimumab (HUMIRA PEN) 40 MG/0.8ML PNKT, every 14 (fourteen) days. , Disp: , Rfl:  .  amLODipine (NORVASC) 5 MG tablet, Take 1 tablet (5 mg total) by mouth daily., Disp: 90 tablet, Rfl: 0 .  aspirin EC 81 MG tablet, Take by mouth., Disp: , Rfl:  .  calcium carbonate (OS-CAL - DOSED IN MG OF ELEMENTAL CALCIUM) 1250 (500 CA) MG tablet, Take by mouth., Disp: , Rfl:  .  fluticasone (FLONASE) 50 MCG/ACT nasal spray, Place 2 sprays into both nostrils daily., Disp: 16 g, Rfl: 6 .   irbesartan (AVAPRO) 150 MG tablet, Take 150 mg by mouth daily., Disp: , Rfl:  .  ketoconazole (NIZORAL) 2 % cream, Apply 1 application topically daily., Disp: , Rfl:  .  LACTOBACILLUS ACID-PECTIN PO, Take by mouth., Disp: , Rfl:  .  magnesium gluconate (MAGONATE) 500 MG tablet, Take 1,000 mg by mouth daily., Disp: , Rfl:  .  Multiple Vitamins-Minerals (MULTIVITAMIN WITH MINERALS) tablet, Take 1 tablet by mouth daily., Disp: , Rfl:  .  mupirocin cream (BACTROBAN) 2 %, Apply topically daily as needed., Disp: 30 g, Rfl: 1 .  naftifine (NAFTIN) 1 % cream, Apply topically., Disp: , Rfl:  .  omeprazole (PRILOSEC) 40 MG capsule, Take 1 capsule (40 mg total) by mouth daily., Disp: 90 capsule, Rfl: 1 .  ranitidine (ZANTAC) 75 MG tablet, Take 75 mg by mouth 2 (two) times daily., Disp: , Rfl:  .  rosuvastatin (CRESTOR) 10 MG tablet, TAKE 1 TABLET EVERY DAY, Disp: 30 tablet, Rfl: 3 .  zolpidem (AMBIEN) 10 MG tablet, TAKE 1 TABLET BY MOUTH AT BEDTIME AS NEEDED FOR SLEEP, Disp: 30 tablet, Rfl: 2 .  rosuvastatin (CRESTOR) 10 MG tablet, Take 1 tablet (10 mg  total) by mouth daily., Disp: 90 tablet, Rfl: 3  Allergies  Allergen Reactions  . Alcohol Other (See Comments)  . Lisinopril Other (See Comments)    cough cough  . Other Other (See Comments)    Mycins = chest pain, [place] of [place] = skin test results. Mycins = chest pain, [place] = skin test results. Mycins = chest pain, [place] = skin test results.  Nilsa Nutting Alcohol  [Lanolin] Other (See Comments)  . Clobetasol Rash    Severe blisters Severe blisters Severe blisters     ROS  Please see history of present illness for complete discussion of ROS  Objective  Vitals:   06/02/17 1452  BP: 118/67  Pulse: 84  Resp: 16  Temp: 97.8 F (36.6 C)  TempSrc: Oral  SpO2: 96%  Weight: 168 lb 11.2 oz (76.5 kg)  Height: 5\' 6"  (1.676 m)    Physical Exam  Constitutional: He is oriented to person, place, and time and well-developed,  well-nourished, and in no distress.  HENT:  Head: Normocephalic and atraumatic.  Cardiovascular: Normal rate, regular rhythm and normal heart sounds.   No murmur heard. Pulmonary/Chest: Effort normal and breath sounds normal. He has no wheezes.  Musculoskeletal: He exhibits no edema.       Right forearm: He exhibits tenderness. He exhibits no swelling and no edema.       Left forearm: He exhibits tenderness. He exhibits no swelling and no edema.       Arms:      Right lower leg: He exhibits tenderness. He exhibits no swelling and no edema.       Left lower leg: He exhibits tenderness. He exhibits no swelling and no edema.       Legs: Neurological: He is alert and oriented to person, place, and time.  Nursing note and vitals reviewed.     Assessment & Plan  1. Myalgia Unclear etiology, rule out muscle injury, dehydration and electrolyte disturbance, advised to temporarily discontinue statin for 2 weeks to determine if it's a statin-induced myalgia. Reassess in 1 month - CK (Creatine Kinase) - Aldolase - CBC with Differential/Platelet - Magnesium - Comprehensive metabolic panel   Dontell Mian Asad A. [facility] 06/02/2017 2:56 PM

## 2017-06-03 LAB — COMPREHENSIVE METABOLIC PANEL
ALK PHOS: 86 U/L (ref 40–115)
ALT: 26 U/L (ref 9–46)
AST: 22 U/L (ref 10–35)
Albumin: 5 g/dL (ref 3.6–5.1)
BUN: 22 mg/dL (ref 7–25)
CALCIUM: 9.7 mg/dL (ref 8.6–10.3)
CO2: 20 mmol/L (ref 20–32)
Chloride: 106 mmol/L (ref 98–110)
Creat: 0.85 mg/dL (ref 0.70–1.25)
GLUCOSE: 91 mg/dL (ref 65–99)
POTASSIUM: 4.1 mmol/L (ref 3.5–5.3)
Sodium: 140 mmol/L (ref 135–146)
Total Bilirubin: 0.6 mg/dL (ref 0.2–1.2)
Total Protein: 7.2 g/dL (ref 6.1–8.1)

## 2017-06-03 LAB — MAGNESIUM: Magnesium: 2.3 mg/dL (ref 1.5–2.5)

## 2017-06-03 LAB — CK: CK TOTAL: 242 U/L — AB (ref 44–196)

## 2017-06-04 ENCOUNTER — Ambulatory Visit (INDEPENDENT_AMBULATORY_CARE_PROVIDER_SITE_OTHER): Payer: Medicare Other | Admitting: Family Medicine

## 2017-06-04 ENCOUNTER — Encounter: Payer: Self-pay | Admitting: Family Medicine

## 2017-06-04 VITALS — BP 128/76 | HR 62 | Temp 97.8°F | Resp 16 | Ht 66.0 in | Wt 168.3 lb

## 2017-06-04 DIAGNOSIS — S8991XA Unspecified injury of right lower leg, initial encounter: Secondary | ICD-10-CM | POA: Diagnosis not present

## 2017-06-04 DIAGNOSIS — S91331A Puncture wound without foreign body, right foot, initial encounter: Secondary | ICD-10-CM

## 2017-06-04 DIAGNOSIS — Z23 Encounter for immunization: Secondary | ICD-10-CM

## 2017-06-04 DIAGNOSIS — S83411A Sprain of medial collateral ligament of right knee, initial encounter: Secondary | ICD-10-CM | POA: Diagnosis not present

## 2017-06-04 LAB — ALDOLASE: Aldolase: 5.2 U/L (ref <=8.1)

## 2017-06-04 NOTE — Progress Notes (Addendum)
Name: Christian Santos   MRN: [id number]    DOB: 1951-03-03   Date:06/04/2017       Progress Note  Subjective  Chief Complaint  Chief Complaint  Patient presents with  . Knee Injury    right knee for 1 day, Stepped in hole and twisted knee    HPI  Pt presents with RIGHT knee injury yesterday - RIGHT knee inverted.  Was able to bear weight right away but has been "hopping" to get around. Has small bruise to left medial knee.  When walking pt has 8/10 pain; pain with full extension and flexion. Has tried Advil with minimal relief, has been icing regularly.  RIGHT foot puncture wound/needs Td updated: Stepped on a small finishing nail late last week - asks to have his tetanus shot updated. The RIGHT pad of his foot was affected, but has healed and no wound is present now.  Patient Active Problem List   Diagnosis Date Noted  . Tick bite of back 02/25/2017  . Acute maxillary sinusitis 11/05/2016  . Solitary pulmonary nodule on lung CT 09/03/2016  . Annual physical exam 04/15/2016  . Screening for tuberculosis 03/13/2016  . Hyperglycemia 03/06/2016  . Localized bacterial infection of skin 03/06/2016  . Insomnia 08/28/2015  . Essential hypertension 08/28/2015  . Hyperlipidemia 08/28/2015  . Allergic rhinitis due to pollen 08/28/2015  . Hemochromatosis 05/03/2015  . Gastro-esophageal reflux disease without esophagitis 12/10/2014    Social History  Substance Use Topics  . Smoking status: Never Smoker  . Smokeless tobacco: Never Used  . Alcohol use 0.0 oz/week     Current Outpatient Prescriptions:  .  Adalimumab (HUMIRA PEN) 40 MG/0.8ML PNKT, every 14 (fourteen) days. , Disp: , Rfl:  .  amLODipine (NORVASC) 5 MG tablet, Take 1 tablet (5 mg total) by mouth daily., Disp: 90 tablet, Rfl: 0 .  aspirin EC 81 MG tablet, Take by mouth., Disp: , Rfl:  .  calcium carbonate (OS-CAL - DOSED IN MG OF ELEMENTAL CALCIUM) 1250 (500 CA) MG tablet, Take by mouth., Disp: , Rfl:  .  fluticasone  (FLONASE) 50 MCG/ACT nasal spray, Place 2 sprays into both nostrils daily., Disp: 16 g, Rfl: 6 .  irbesartan (AVAPRO) 150 MG tablet, Take 150 mg by mouth daily., Disp: , Rfl:  .  ketoconazole (NIZORAL) 2 % cream, Apply 1 application topically daily., Disp: , Rfl:  .  LACTOBACILLUS ACID-PECTIN PO, Take by mouth., Disp: , Rfl:  .  magnesium gluconate (MAGONATE) 500 MG tablet, Take 1,000 mg by mouth daily., Disp: , Rfl:  .  Multiple Vitamins-Minerals (MULTIVITAMIN WITH MINERALS) tablet, Take 1 tablet by mouth daily., Disp: , Rfl:  .  mupirocin cream (BACTROBAN) 2 %, Apply topically daily as needed., Disp: 30 g, Rfl: 1 .  naftifine (NAFTIN) 1 % cream, Apply topically., Disp: , Rfl:  .  omeprazole (PRILOSEC) 40 MG capsule, Take 1 capsule (40 mg total) by mouth daily., Disp: 90 capsule, Rfl: 1 .  ranitidine (ZANTAC) 75 MG tablet, Take 75 mg by mouth 2 (two) times daily., Disp: , Rfl:  .  zolpidem (AMBIEN) 10 MG tablet, TAKE 1 TABLET BY MOUTH AT BEDTIME AS NEEDED FOR SLEEP, Disp: 30 tablet, Rfl: 2 .  rosuvastatin (CRESTOR) 10 MG tablet, Take 1 tablet (10 mg total) by mouth daily., Disp: 90 tablet, Rfl: 3 .  rosuvastatin (CRESTOR) 10 MG tablet, TAKE 1 TABLET EVERY DAY (Patient not taking: Reported on 06/04/2017), Disp: 30 tablet, Rfl: 3  Allergies  Allergen  Reactions  . Alcohol Other (See Comments)  . Lisinopril Other (See Comments)    cough cough  . Other Other (See Comments)    Mycins = chest pain, [place] of [place] = skin test results. Mycins = chest pain, [place] = skin test results. Mycins = chest pain, [place] = skin test results.  Nilsa Nutting Alcohol  [Lanolin] Other (See Comments)  . Clobetasol Rash    Severe blisters Severe blisters Severe blisters    ROS  Constitutional: Negative for fever or weight change.  Respiratory: Negative for cough and shortness of breath.   Cardiovascular: Negative for chest pain or palpitations.  Gastrointestinal: Negative for abdominal pain, no bowel  changes.  Musculoskeletal: Positive for gait problem or joint swelling.  Skin: Negative for rash.  Neurological: Negative for dizziness or headache.  No other specific complaints in a complete review of systems (except as listed in HPI above).  Objective  Vitals:   06/04/17 0833  BP: 128/76  Pulse: 62  Resp: 16  Temp: 97.8 F (36.6 C)  TempSrc: Oral  SpO2: 96%  Weight: 168 lb 4.8 oz (76.3 kg)  Height: 5' 6"  (1.676 m)    Body mass index is 27.16 kg/m.  Nursing Note and Vital Signs reviewed.  Physical Exam  Constitutional: Patient appears well-developed and well-nourished. Obese, No distress.  HEENT: head atraumatic, normocephalic Cardiovascular: Normal rate, regular rhythm, S1/S2 present.  No murmur or rub heard. No BLE edema. Pulmonary/Chest: Effort normal and breath sounds clear. No respiratory distress or retractions. Psychiatric: Patient has a normal mood and affect. behavior is normal. Judgment and thought content normal. MSK: RIGHT knee examination finds no crepitus or laxity, significant pain with flexion and full extension as well as will eversion of the knee; no pain with inversion, minimal swelling present but significant tenderness to medial knee with very small bruise present. Pedal pulse +2, skin is warm and dry, cap refill <3 seconds, normal popliteal fossa.  Antalgic gait present. No other MSK abnormalities noted. Skin: Bottom of right foot is warm, dry, and intact with no wound present. Otherwise skin is WNL.  Recent Results (from the past 2160 hour(s))  CBC with Differential/Platelet     Status: None   Collection Time: 05/05/17  3:27 PM  Result Value Ref Range   WBC 6.3 3.8 - 10.8 K/uL   RBC 4.79 4.20 - 5.80 MIL/uL   Hemoglobin 15.3 13.2 - 17.1 g/dL   HCT 44.8 38.5 - 50.0 %   MCV 93.5 80.0 - 100.0 fL   MCH 31.9 27.0 - 33.0 pg   MCHC 34.2 32.0 - 36.0 g/dL   RDW 12.8 11.0 - 15.0 %   Platelets 306 140 - 400 K/uL   MPV 9.7 7.5 - 12.5 fL   Neutro Abs 4,410  1,500 - 7,800 cells/uL   Lymphs Abs 1,386 850 - 3,900 cells/uL   Monocytes Absolute 441 200 - 950 cells/uL   Eosinophils Absolute 63 15 - 500 cells/uL   Basophils Absolute 0 0 - 200 cells/uL   Neutrophils Relative % 70 %   Lymphocytes Relative 22 %   Monocytes Relative 7 %   Eosinophils Relative 1 %   Basophils Relative 0 %   Smear Review Criteria for review not met   TSH     Status: None   Collection Time: 05/05/17  3:27 PM  Result Value Ref Range   TSH 1.51 0.40 - 4.50 mIU/L  VITAMIN D 25 Hydroxy (Vit-D Deficiency, Fractures)  Status: None   Collection Time: 05/05/17  3:27 PM  Result Value Ref Range   Vit D, 25-Hydroxy 43 30 - 100 ng/mL    Comment: Vitamin D Status           25-OH Vitamin D        Deficiency                <20 ng/mL        Insufficiency         20 - 29 ng/mL        Optimal             > or = 30 ng/mL   For 25-OH Vitamin D testing on patients on D2-supplementation and patients for whom quantitation of D2 and D3 fractions is required, the QuestAssureD 25-OH VIT D, (D2,D3), LC/MS/MS is recommended: order code [id number] (patients > 2 yrs).   PSA     Status: None   Collection Time: 05/05/17  3:27 PM  Result Value Ref Range   PSA 2.3 <=4.0 ng/mL    Comment:   The total PSA value from this assay system is standardized against the WHO standard. The test result will be approximately 20% lower when compared to the equimolar-standardized total PSA ([employer] Coulter). Comparison of serial PSA results should be interpreted with this fact in mind.   This test was performed using the Siemens chemiluminescent method. Values obtained from different assay methods cannot be used interchangeably. PSA levels, regardless of value, should not be interpreted as absolute evidence of the presence or absence of disease.     Hepatitis C antibody     Status: None   Collection Time: 05/05/17  3:27 PM  Result Value Ref Range   HCV Ab NON-REACTIVE NON-REACTIVE    Comment:                                                                         This test is for screening purposes only.  Reactive results should be confirmed by an alternative method.  Suggest HCV Qualitative, PCR, test code 83130.  Specimens will be stable for reflex testing up to 3 days after collection.   CK (Creatine Kinase)     Status: Abnormal   Collection Time: 06/02/17  3:22 PM  Result Value Ref Range   Total CK 242 (H) 44 - 196 U/L  Aldolase     Status: None (Preliminary result)   Collection Time: 06/02/17  3:22 PM  Result Value Ref Range   Aldolase    CBC with Differential/Platelet     Status: None   Collection Time: 06/02/17  3:22 PM  Result Value Ref Range   WBC 6.4 3.8 - 10.8 K/uL   RBC 4.90 4.20 - 5.80 MIL/uL   Hemoglobin 15.8 13.2 - 17.1 g/dL   HCT 44.8 38.5 - 50.0 %   MCV 91.4 80.0 - 100.0 fL   MCH 32.2 27.0 - 33.0 pg   MCHC 35.3 32.0 - 36.0 g/dL   RDW 12.5 11.0 - 15.0 %   Platelets 294 140 - 400 K/uL   MPV 9.6 7.5 - 12.5 fL   Neutro Abs 4,608 1,500 - 7,800 cells/uL   Lymphs Abs 1,280 850 -  3,900 cells/uL   Monocytes Absolute 448 200 - 950 cells/uL   Eosinophils Absolute 64 15 - 500 cells/uL   Basophils Absolute 0 0 - 200 cells/uL   Neutrophils Relative % 72 %   Lymphocytes Relative 20 %   Monocytes Relative 7 %   Eosinophils Relative 1 %   Basophils Relative 0 %   Smear Review Criteria for review not met   Magnesium     Status: None   Collection Time: 06/02/17  3:22 PM  Result Value Ref Range   Magnesium 2.3 1.5 - 2.5 mg/dL  Comprehensive metabolic panel     Status: None   Collection Time: 06/02/17  3:22 PM  Result Value Ref Range   Sodium 140 135 - 146 mmol/L   Potassium 4.1 3.5 - 5.3 mmol/L   Chloride 106 98 - 110 mmol/L   CO2 20 20 - 32 mmol/L    Comment: ** Please note change in reference range(s). **      Glucose, Bld 91 65 - 99 mg/dL   BUN 22 7 - 25 mg/dL   Creat 0.85 0.70 - 1.25 mg/dL    Comment:   For patients > or = 66 years of age: The upper  reference limit for Creatinine is approximately 13% higher for people identified as African-American.      Total Bilirubin 0.6 0.2 - 1.2 mg/dL   Alkaline Phosphatase 86 40 - 115 U/L   AST 22 10 - 35 U/L   ALT 26 9 - 46 U/L   Total Protein 7.2 6.1 - 8.1 g/dL   Albumin 5.0 3.6 - 5.1 g/dL   Calcium 9.7 8.6 - 10.3 mg/dL     Assessment & Plan  1. Injury of right knee, initial encounter - Ambulatory referral to Orthopedic Surgery - Appointment with [facility] at 9:30am today.  3. Need for Td vaccine - Td : Tetanus/diphtheria >7yo Preservative  free  4. Puncture wound of right foot, initial encounter - Td : Tetanus/diphtheria >7yo Preservative  free  -Reviewed Health Maintenance: Td given.  I have reviewed this encounter including the documentation in this note and/or discussed this patient with the Johney Maine, FNP, NP-C. I am certifying that I agree with the content of this note as supervising physician.  Steele Sizer, MD [facility] 06/04/2017, 9:56 AM

## 2017-06-04 NOTE — Patient Instructions (Signed)
Please go to [facility] at 9:30am today.

## 2017-06-10 ENCOUNTER — Encounter: Payer: Self-pay | Admitting: Family Medicine

## 2017-06-16 ENCOUNTER — Other Ambulatory Visit: Payer: Self-pay

## 2017-06-16 DIAGNOSIS — R748 Abnormal levels of other serum enzymes: Secondary | ICD-10-CM

## 2017-06-16 LAB — CK: Total CK: 181 U/L (ref 44–196)

## 2017-06-16 NOTE — Progress Notes (Signed)
Reordered CK (creatine kinase) per Dr. Manuella Ghazi for elevated Creatine kinase levels

## 2017-06-18 ENCOUNTER — Encounter: Payer: Self-pay | Admitting: Family Medicine

## 2017-06-29 ENCOUNTER — Other Ambulatory Visit: Payer: Self-pay | Admitting: Family Medicine

## 2017-06-29 DIAGNOSIS — E7800 Pure hypercholesterolemia, unspecified: Secondary | ICD-10-CM

## 2017-06-29 MED ORDER — ATORVASTATIN CALCIUM 20 MG PO TABS: 20.0000 mg | ORAL_TABLET | Freq: Every day | ORAL | 0 refills | Status: DC

## 2017-07-14 ENCOUNTER — Ambulatory Visit: Payer: Self-pay | Admitting: Family Medicine

## 2017-07-16 ENCOUNTER — Encounter: Payer: Self-pay | Admitting: Family Medicine

## 2017-07-16 ENCOUNTER — Ambulatory Visit (INDEPENDENT_AMBULATORY_CARE_PROVIDER_SITE_OTHER): Payer: Medicare Other | Admitting: Family Medicine

## 2017-07-16 VITALS — BP 112/80 | HR 83 | Ht 66.0 in | Wt 170.1 lb

## 2017-07-16 DIAGNOSIS — Z23 Encounter for immunization: Secondary | ICD-10-CM | POA: Diagnosis not present

## 2017-07-16 DIAGNOSIS — M79602 Pain in left arm: Secondary | ICD-10-CM

## 2017-07-16 DIAGNOSIS — R5383 Other fatigue: Secondary | ICD-10-CM | POA: Diagnosis not present

## 2017-07-16 NOTE — Progress Notes (Signed)
Name: Christian Santos   MRN: [id number]    DOB: 03-04-1951   Date:07/16/2017       Progress Note  Subjective  Chief Complaint  Chief Complaint  Patient presents with  . Hand Pain  . Leg Pain  . Medication Management    wants to discuss crestor     HPI  Patient feels tired and fatigued, this has been going on at least 6-8 months, he is usually very active and is up and about at work for much of the day but when he sits down after work, he feels excessively tired, more so than he used to, sometimes he drinks an energy drink to help give him a boost. He was on Humira for psoriasis but could not afford it and has since stopped, that was around the time he started noticing fatigue. 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 He also feels tightness in his calf muscles.    Past Medical History:  Diagnosis Date  . Hyperlipidemia   . Hypertension   . Insomnia     Past Surgical History:  Procedure Laterality Date  . HAND SURGERY      Family History  Problem Relation Age of Onset  . Heart attack Father   . Heart attack Brother   . Heart disease Brother     Social History   Social History  . Marital status: Married    Spouse name: N/A  . Number of children: N/A  . Years of education: N/A   Occupational History  . Not on file.   Social History Main Topics  . Smoking status: Never Smoker  . Smokeless tobacco: Never Used  . Alcohol use 0.0 oz/week  . Drug use: No  . Sexual activity: Yes    Partners: Female   Other Topics Concern  . Not on file   Social History Narrative  . No narrative on file     Current Outpatient Prescriptions:  .  Adalimumab (HUMIRA PEN) 40 MG/0.8ML PNKT, every 14 (fourteen) days. , Disp: , Rfl:  .  amLODipine (NORVASC) 5 MG tablet, Take 1 tablet (5 mg  total) by mouth daily., Disp: 90 tablet, Rfl: 0 .  aspirin EC 81 MG tablet, Take by mouth., Disp: , Rfl:  .  atorvastatin (LIPITOR) 20 MG tablet, Take 1 tablet (20 mg total) by mouth daily at 6 PM., Disp: 90 tablet, Rfl: 0 .  calcium carbonate (OS-CAL - DOSED IN MG OF ELEMENTAL CALCIUM) 1250 (500 CA) MG tablet, Take by mouth., Disp: , Rfl:  .  fluticasone (FLONASE) 50 MCG/ACT nasal spray, Place 2 sprays into both nostrils daily., Disp: 16 g, Rfl: 6 .  irbesartan (AVAPRO) 150 MG tablet, Take 150 mg by mouth daily., Disp: , Rfl:  .  ketoconazole (NIZORAL) 2 % cream, Apply 1 application topically daily., Disp: , Rfl:  .  LACTOBACILLUS ACID-PECTIN PO, Take by mouth., Disp: , Rfl:  .  magnesium gluconate (MAGONATE) 500 MG tablet, Take 1,000 mg by mouth daily., Disp: , Rfl:  .  meloxicam (MOBIC) 15 MG tablet, , Disp: , Rfl:  .  Multiple Vitamins-Minerals (MULTIVITAMIN WITH MINERALS) tablet, Take 1 tablet by mouth daily., Disp: , Rfl:  .  naftifine (NAFTIN) 1 % cream, Apply topically., Disp: , Rfl:  .  omeprazole (PRILOSEC)  40 MG capsule, Take 1 capsule (40 mg total) by mouth daily., Disp: 90 capsule, Rfl: 1 .  ranitidine (ZANTAC) 75 MG tablet, Take 75 mg by mouth 2 (two) times daily., Disp: , Rfl:  .  rosuvastatin (CRESTOR) 10 MG tablet, , Disp: , Rfl:  .  zolpidem (AMBIEN) 10 MG tablet, TAKE 1 TABLET BY MOUTH AT BEDTIME AS NEEDED FOR SLEEP, Disp: 30 tablet, Rfl: 2  Allergies  Allergen Reactions  . Alcohol Other (See Comments)  . Erythromycin Base   . Lisinopril Other (See Comments)    cough cough  . Other Other (See Comments)    Mycins = chest pain, [place] = skin test results. Mycins = chest pain, [place] = skin test results. Mycins = chest pain, [place] = skin test results.  Nilsa Nutting Alcohol  [Lanolin] Other (See Comments)  . Clobetasol Rash    Severe blisters Severe blisters Severe blisters     Review of Systems  Constitutional: Positive for malaise/fatigue. Negative  for chills, fever and weight loss.  Eyes: Negative for blurred vision and double vision.  Musculoskeletal: Positive for myalgias. Negative for back pain and joint pain.  Neurological: Negative for dizziness.    Objective  Vitals:   07/16/17 1518  BP: 112/80  Pulse: 83  SpO2: 98%  Weight: 170 lb 1.6 oz (77.2 kg)  Height: 5\' 6"  (1.676 m)    Physical Exam  Constitutional: He is well-developed, well-nourished, and in no distress.  Cardiovascular: Normal rate, regular rhythm and normal heart sounds.   No murmur heard. Pulmonary/Chest: Effort normal and breath sounds normal. He has no wheezes.  Musculoskeletal: He exhibits no edema.       Right forearm: He exhibits tenderness. He exhibits no swelling and no edema.       Left forearm: He exhibits tenderness. He exhibits no swelling and no edema.  Nursing note and vitals reviewed.     Assessment & Plan  1. Flu vaccine need  - Flu vaccine HIGH DOSE PF (Fluzone High dose)  2. Fatigue, unspecified type Unclear etiology, suspect fibromyalgia given that he is feeling both fatigue and has muscle pain symptoms, rule out autoimmune disease and other sources of inflammation, may need referral to rheumatology - B12 and Folate Panel - ANA w/Reflex if Positive - Sedimentation rate  3. Pain in left arm Suspect a component of fibromyalgia, obtain x-rays of left - DG Forearm Left; Future   Jaydeen Odor Asad A. [facility] 07/16/2017 3:27 PM

## 2017-07-20 LAB — B12 AND FOLATE PANEL

## 2017-07-20 LAB — SEDIMENTATION RATE: Sed Rate: 2 mm/h (ref 0–20)

## 2017-07-22 ENCOUNTER — Telehealth: Payer: Self-pay

## 2017-07-22 ENCOUNTER — Other Ambulatory Visit: Payer: Self-pay

## 2017-07-22 DIAGNOSIS — R5383 Other fatigue: Secondary | ICD-10-CM

## 2017-07-22 NOTE — Telephone Encounter (Signed)
Received letter form [facility] regarding pt labs Vit B-12 and Folate serum test not performed no serum was received. Pt has been notified and is willing to come in this week to have it redrawn. Gave pt lab hours; labs are up front.

## 2017-07-27 ENCOUNTER — Telehealth: Payer: Self-pay

## 2017-07-27 NOTE — Telephone Encounter (Signed)
Left a message to give me a call back

## 2017-07-27 NOTE — Telephone Encounter (Signed)
-----   Message from Roselee Nova, MD sent at 07/22/2017  8:41 PM EDT ----- Vitamin B12 level was not done because no serum was received, please contact patient for recollection  Sed rate is within normal range.

## 2017-08-11 ENCOUNTER — Other Ambulatory Visit: Payer: Self-pay | Admitting: Family Medicine

## 2017-08-11 DIAGNOSIS — K219 Gastro-esophageal reflux disease without esophagitis: Secondary | ICD-10-CM

## 2017-08-18 ENCOUNTER — Other Ambulatory Visit: Payer: Self-pay | Admitting: Family Medicine

## 2017-08-18 DIAGNOSIS — G4700 Insomnia, unspecified: Secondary | ICD-10-CM

## 2017-08-20 ENCOUNTER — Other Ambulatory Visit: Payer: Self-pay | Admitting: Family Medicine

## 2017-08-20 DIAGNOSIS — G4700 Insomnia, unspecified: Secondary | ICD-10-CM

## 2017-10-07 DIAGNOSIS — H04123 Dry eye syndrome of bilateral lacrimal glands: Secondary | ICD-10-CM | POA: Diagnosis not present

## 2017-10-07 DIAGNOSIS — H25813 Combined forms of age-related cataract, bilateral: Secondary | ICD-10-CM | POA: Diagnosis not present

## 2017-10-12 DIAGNOSIS — L400 Psoriasis vulgaris: Secondary | ICD-10-CM | POA: Diagnosis not present

## 2017-10-12 DIAGNOSIS — D1800 Hemangioma unspecified site: Secondary | ICD-10-CM | POA: Diagnosis not present

## 2017-10-12 DIAGNOSIS — D229 Melanocytic nevi, unspecified: Secondary | ICD-10-CM | POA: Diagnosis not present

## 2017-10-12 DIAGNOSIS — L812 Freckles: Secondary | ICD-10-CM | POA: Diagnosis not present

## 2017-10-12 DIAGNOSIS — L578 Other skin changes due to chronic exposure to nonionizing radiation: Secondary | ICD-10-CM | POA: Diagnosis not present

## 2017-10-12 DIAGNOSIS — L4052 Psoriatic arthritis mutilans: Secondary | ICD-10-CM | POA: Diagnosis not present

## 2017-10-12 DIAGNOSIS — Z1283 Encounter for screening for malignant neoplasm of skin: Secondary | ICD-10-CM | POA: Diagnosis not present

## 2017-10-12 DIAGNOSIS — L821 Other seborrheic keratosis: Secondary | ICD-10-CM | POA: Diagnosis not present

## 2017-10-15 ENCOUNTER — Ambulatory Visit (INDEPENDENT_AMBULATORY_CARE_PROVIDER_SITE_OTHER): Payer: Medicare Other | Admitting: Family Medicine

## 2017-10-15 ENCOUNTER — Encounter: Payer: Self-pay | Admitting: Family Medicine

## 2017-10-15 VITALS — BP 126/64 | HR 84 | Temp 98.3°F | Resp 16 | Ht 66.0 in | Wt 170.4 lb

## 2017-10-15 DIAGNOSIS — Z23 Encounter for immunization: Secondary | ICD-10-CM | POA: Diagnosis not present

## 2017-10-15 DIAGNOSIS — I10 Essential (primary) hypertension: Secondary | ICD-10-CM | POA: Diagnosis not present

## 2017-10-15 DIAGNOSIS — G4700 Insomnia, unspecified: Secondary | ICD-10-CM

## 2017-10-15 DIAGNOSIS — E7800 Pure hypercholesterolemia, unspecified: Secondary | ICD-10-CM | POA: Diagnosis not present

## 2017-10-15 MED ORDER — ZOSTER VAC RECOMB ADJUVANTED 50 MCG/0.5ML IM SUSR: 0.5000 mL | Freq: Once | INTRAMUSCULAR | 0 refills | Status: AC

## 2017-10-15 MED ORDER — ZOLPIDEM TARTRATE 10 MG PO TABS: 10.0000 mg | ORAL_TABLET | Freq: Every evening | ORAL | 2 refills | Status: DC | PRN

## 2017-10-15 MED ORDER — AMLODIPINE BESYLATE 5 MG PO TABS: 5.0000 mg | ORAL_TABLET | Freq: Every day | ORAL | 0 refills | Status: DC

## 2017-10-15 NOTE — Progress Notes (Signed)
Name: Christian Santos   MRN: [id number]    DOB: [birth year]   Date:10/15/2017       Progress Note  Subjective  Chief Complaint  Chief Complaint  Patient presents with  . Medication Refill    3 month F/U  . Hypertension    Denies any symptoms  . Hyperlipidemia  . Insomnia    Stable with medication  . Allergic Rhinitis     Congested and using allergy medication    Hypertension  This is a chronic problem. The problem is unchanged. The problem is controlled. Pertinent negatives include no blurred vision, chest pain, headaches or palpitations. Past treatments include angiotensin blockers and calcium channel blockers. There is no history of kidney disease, CAD/MI or CVA.  Hyperlipidemia  This is a chronic problem. The problem is uncontrolled. Recent lipid tests were reviewed and are high. Pertinent negatives include no chest pain. He is currently on no antihyperlipidemic treatment (he is not on statins becaus eof myalgias.).  Insomnia  Primary symptoms: no sleep disturbance, no difficulty falling asleep, no frequent awakening.  The onset quality is gradual. Typical bedtime:  10-11 P.M..  How long after going to bed to you fall asleep: less than 15 minutes.       Past Medical History:  Diagnosis Date  . Hyperlipidemia   . Hypertension   . Insomnia     Past Surgical History:  Procedure Laterality Date  . HAND SURGERY      Family History  Problem Relation Age of Onset  . Heart attack Father   . Heart attack Brother   . Heart disease Brother     Social History   Socioeconomic History  . Marital status: Married    Spouse name: Not on file  . Number of children: Not on file  . Years of education: Not on file  . Highest education level: Not on file  Social Needs  . Financial resource strain: Not on file  . Food insecurity - worry: Not on file  . Food insecurity - inability: Not on file  . Transportation needs - medical: Not on file  . Transportation needs -  non-medical: Not on file  Occupational History  . Not on file  Tobacco Use  . Smoking status: Never Smoker  . Smokeless tobacco: Never Used  Substance and Sexual Activity  . Alcohol use: Yes    Alcohol/week: 0.0 oz  . Drug use: No  . Sexual activity: Yes    Partners: Female  Other Topics Concern  . Not on file  Social History Narrative  . Not on file     Current Outpatient Medications:  .  Adalimumab (HUMIRA PEN) 40 MG/0.8ML PNKT, every 14 (fourteen) days. , Disp: , Rfl:  .  amLODipine (NORVASC) 5 MG tablet, Take 1 tablet (5 mg total) by mouth daily., Disp: 90 tablet, Rfl: 0 .  aspirin EC 81 MG tablet, Take by mouth., Disp: , Rfl:  .  calcium carbonate (OS-CAL - DOSED IN MG OF ELEMENTAL CALCIUM) 1250 (500 CA) MG tablet, Take by mouth., Disp: , Rfl:  .  fluticasone (FLONASE) 50 MCG/ACT nasal spray, Place 2 sprays into both nostrils daily., Disp: 16 g, Rfl: 6 .  irbesartan (AVAPRO) 150 MG tablet, Take 150 mg by mouth daily., Disp: , Rfl:  .  ketoconazole (NIZORAL) 2 % cream, Apply 1 application topically daily., Disp: , Rfl:  .  LACTOBACILLUS ACID-PECTIN PO, Take by mouth., Disp: , Rfl:  .  magnesium gluconate (MAGONATE) 500  MG tablet, Take 1,000 mg by mouth daily., Disp: , Rfl:  .  Multiple Vitamins-Minerals (MULTIVITAMIN WITH MINERALS) tablet, Take 1 tablet by mouth daily., Disp: , Rfl:  .  naftifine (NAFTIN) 1 % cream, Apply topically., Disp: , Rfl:  .  omeprazole (PRILOSEC) 40 MG capsule, TAKE ONE CAPSULE DAILY, Disp: 90 capsule, Rfl: 1 .  zolpidem (AMBIEN) 10 MG tablet, TAKE 1 TABLET BY MOUTH AT BEDTIME AS NEEDED FOR SLEEP, Disp: 30 tablet, Rfl: 2  Allergies  Allergen Reactions  . Alcohol Other (See Comments)  . Erythromycin Base   . Lisinopril Other (See Comments)    cough cough  . Other Other (See Comments)    Mycins = chest pain, [place] = skin test results. Mycins = chest pain, [place] = skin test results. Mycins = chest pain, [place] = skin test  results.  Nilsa Nutting Alcohol  [Lanolin] Other (See Comments)  . Clobetasol Rash    Severe blisters Severe blisters Severe blisters     Review of Systems  Eyes: Negative for blurred vision.  Cardiovascular: Negative for chest pain and palpitations.  Neurological: Negative for headaches.  Psychiatric/Behavioral: Negative for sleep disturbance. The patient has insomnia.      Objective  Vitals:   10/15/17 1516  BP: 126/64  Pulse: 84  Resp: 16  Temp: 98.3 F (36.8 C)  TempSrc: Oral  SpO2: 95%  Weight: 170 lb 6.4 oz (77.3 kg)  Height: 5\' 6"  (1.676 m)    Physical Exam  Constitutional: He is oriented to person, place, and time and well-developed, well-nourished, and in no distress.  HENT:  Head: Normocephalic and atraumatic.  Cardiovascular: Normal rate, regular rhythm and normal heart sounds.  No murmur heard. Pulmonary/Chest: Effort normal and breath sounds normal. He has no wheezes.  Neurological: He is alert and oriented to person, place, and time.  Psychiatric: Mood, memory, affect and judgment normal.  Nursing note and vitals reviewed.      Assessment & Plan  1. Essential hypertension BP stable on present hypertensive treat - amLODipine (NORVASC) 5 MG tablet; Take 1 tablet (5 mg total) by mouth daily.  Dispense: 90 tablet; Refill: 0  2. Pure hypercholesterolemia Patient is intolerant to atorvastatin, has been advised by the cardiologist to discontinue statins at this time  3. Insomnia, unspecified type  - zolpidem (AMBIEN) 10 MG tablet; Take 1 tablet (10 mg total) by mouth at bedtime as needed. for sleep  Dispense: 30 tablet; Refill: 2  4. Need for shingles vaccine Patient requesting prescription for shingles vaccine, prescription for 2 dosages of shingles vaccine provided - Zoster Vaccine Adjuvanted [facility]) injection; Inject 0.5 mLs into the muscle once for 1 dose.  Dispense: 0.5 mL; Refill: 0   Coe Angelos Asad A. [facility] 10/15/2017 3:27 PM

## 2017-10-22 DIAGNOSIS — E875 Hyperkalemia: Secondary | ICD-10-CM | POA: Diagnosis not present

## 2017-10-22 DIAGNOSIS — I10 Essential (primary) hypertension: Secondary | ICD-10-CM | POA: Diagnosis not present

## 2017-11-03 DIAGNOSIS — L409 Psoriasis, unspecified: Secondary | ICD-10-CM | POA: Diagnosis not present

## 2017-11-03 DIAGNOSIS — M79674 Pain in right toe(s): Secondary | ICD-10-CM | POA: Diagnosis not present

## 2017-11-03 DIAGNOSIS — M79671 Pain in right foot: Secondary | ICD-10-CM | POA: Diagnosis not present

## 2017-11-17 ENCOUNTER — Other Ambulatory Visit: Payer: Self-pay | Admitting: Family Medicine

## 2017-11-17 DIAGNOSIS — K219 Gastro-esophageal reflux disease without esophagitis: Secondary | ICD-10-CM

## 2017-12-31 ENCOUNTER — Other Ambulatory Visit: Payer: Self-pay

## 2017-12-31 DIAGNOSIS — I10 Essential (primary) hypertension: Secondary | ICD-10-CM

## 2017-12-31 MED ORDER — AMLODIPINE BESYLATE 5 MG PO TABS: 5.0000 mg | ORAL_TABLET | Freq: Every day | ORAL | 0 refills | Status: DC

## 2017-12-31 NOTE — Telephone Encounter (Signed)
Refill request for Hypertension medication:  Amlodipine 5 mg  Last office visit pertaining to hypertension: 10/15/2017  BP Readings from Last 3 Encounters:  10/15/17 126/64  07/16/17 112/80  06/04/17 128/76     Lab Results  Component Value Date   CREATININE 0.85 06/02/2017   BUN 22 06/02/2017   NA 140 06/02/2017   K 4.1 06/02/2017   CL 106 06/02/2017   CO2 20 06/02/2017   Follow-ups on file. 02/01/2018

## 2018-01-04 ENCOUNTER — Encounter: Payer: Self-pay | Admitting: Family Medicine

## 2018-01-28 DIAGNOSIS — Z125 Encounter for screening for malignant neoplasm of prostate: Secondary | ICD-10-CM | POA: Diagnosis not present

## 2018-01-28 DIAGNOSIS — L400 Psoriasis vulgaris: Secondary | ICD-10-CM | POA: Diagnosis not present

## 2018-01-28 DIAGNOSIS — F5101 Primary insomnia: Secondary | ICD-10-CM | POA: Diagnosis not present

## 2018-01-28 DIAGNOSIS — Z79899 Other long term (current) drug therapy: Secondary | ICD-10-CM | POA: Diagnosis not present

## 2018-01-28 DIAGNOSIS — I10 Essential (primary) hypertension: Secondary | ICD-10-CM | POA: Diagnosis not present

## 2018-01-28 DIAGNOSIS — R911 Solitary pulmonary nodule: Secondary | ICD-10-CM | POA: Diagnosis not present

## 2018-01-28 DIAGNOSIS — E781 Pure hyperglyceridemia: Secondary | ICD-10-CM | POA: Diagnosis not present

## 2018-01-28 DIAGNOSIS — K219 Gastro-esophageal reflux disease without esophagitis: Secondary | ICD-10-CM | POA: Diagnosis not present

## 2018-02-01 ENCOUNTER — Ambulatory Visit: Payer: Medicare Other | Admitting: Family Medicine

## 2018-04-01 DIAGNOSIS — L4050 Arthropathic psoriasis, unspecified: Secondary | ICD-10-CM | POA: Diagnosis not present

## 2018-04-01 DIAGNOSIS — L409 Psoriasis, unspecified: Secondary | ICD-10-CM | POA: Diagnosis not present

## 2018-05-03 DIAGNOSIS — Z79899 Other long term (current) drug therapy: Secondary | ICD-10-CM | POA: Diagnosis not present

## 2018-05-03 DIAGNOSIS — E781 Pure hyperglyceridemia: Secondary | ICD-10-CM | POA: Diagnosis not present

## 2018-05-03 DIAGNOSIS — Z125 Encounter for screening for malignant neoplasm of prostate: Secondary | ICD-10-CM | POA: Diagnosis not present

## 2018-05-06 DIAGNOSIS — R739 Hyperglycemia, unspecified: Secondary | ICD-10-CM | POA: Diagnosis not present

## 2018-05-06 DIAGNOSIS — I10 Essential (primary) hypertension: Secondary | ICD-10-CM | POA: Diagnosis not present

## 2018-05-06 DIAGNOSIS — Z0000 Encounter for general adult medical examination without abnormal findings: Secondary | ICD-10-CM | POA: Diagnosis not present

## 2018-05-06 DIAGNOSIS — F5101 Primary insomnia: Secondary | ICD-10-CM | POA: Diagnosis not present

## 2018-05-06 DIAGNOSIS — Z79899 Other long term (current) drug therapy: Secondary | ICD-10-CM | POA: Diagnosis not present

## 2018-07-14 DIAGNOSIS — L409 Psoriasis, unspecified: Secondary | ICD-10-CM | POA: Diagnosis not present

## 2018-07-14 DIAGNOSIS — L4050 Arthropathic psoriasis, unspecified: Secondary | ICD-10-CM | POA: Diagnosis not present

## 2018-09-11 DIAGNOSIS — Z23 Encounter for immunization: Secondary | ICD-10-CM | POA: Diagnosis not present

## 2018-10-06 DIAGNOSIS — G4730 Sleep apnea, unspecified: Secondary | ICD-10-CM

## 2018-10-06 DIAGNOSIS — G4733 Obstructive sleep apnea (adult) (pediatric): Secondary | ICD-10-CM

## 2018-10-06 HISTORY — DX: Sleep apnea, unspecified: G47.30

## 2018-10-06 HISTORY — DX: Obstructive sleep apnea (adult) (pediatric): G47.33

## 2019-08-17 ENCOUNTER — Other Ambulatory Visit: Payer: Self-pay

## 2019-08-17 DIAGNOSIS — Z20822 Contact with and (suspected) exposure to covid-19: Secondary | ICD-10-CM

## 2019-08-19 LAB — NOVEL CORONAVIRUS, NAA: SARS-CoV-2, NAA: NOT DETECTED

## 2019-10-20 DIAGNOSIS — M7742 Metatarsalgia, left foot: Secondary | ICD-10-CM | POA: Diagnosis not present

## 2019-10-20 DIAGNOSIS — M65872 Other synovitis and tenosynovitis, left ankle and foot: Secondary | ICD-10-CM | POA: Diagnosis not present

## 2019-10-20 DIAGNOSIS — L4050 Arthropathic psoriasis, unspecified: Secondary | ICD-10-CM | POA: Diagnosis not present

## 2019-10-20 DIAGNOSIS — M7741 Metatarsalgia, right foot: Secondary | ICD-10-CM | POA: Diagnosis not present

## 2019-10-20 DIAGNOSIS — M65871 Other synovitis and tenosynovitis, right ankle and foot: Secondary | ICD-10-CM | POA: Diagnosis not present

## 2019-11-28 DIAGNOSIS — K219 Gastro-esophageal reflux disease without esophagitis: Secondary | ICD-10-CM | POA: Diagnosis not present

## 2019-11-28 DIAGNOSIS — R739 Hyperglycemia, unspecified: Secondary | ICD-10-CM | POA: Diagnosis not present

## 2019-11-28 DIAGNOSIS — L4050 Arthropathic psoriasis, unspecified: Secondary | ICD-10-CM | POA: Diagnosis not present

## 2019-11-28 DIAGNOSIS — Z79899 Other long term (current) drug therapy: Secondary | ICD-10-CM | POA: Diagnosis not present

## 2019-11-28 DIAGNOSIS — G4733 Obstructive sleep apnea (adult) (pediatric): Secondary | ICD-10-CM | POA: Diagnosis not present

## 2019-11-28 DIAGNOSIS — I10 Essential (primary) hypertension: Secondary | ICD-10-CM | POA: Diagnosis not present

## 2019-11-28 DIAGNOSIS — Z125 Encounter for screening for malignant neoplasm of prostate: Secondary | ICD-10-CM | POA: Diagnosis not present

## 2019-11-28 DIAGNOSIS — Z9989 Dependence on other enabling machines and devices: Secondary | ICD-10-CM | POA: Diagnosis not present

## 2020-01-20 NOTE — Progress Notes (Signed)
Patient ID: Christian Santos, male    DOB: 02/11/1951, 69 y.o.   MRN: [id number]  PCP: Steele Sizer, MD  Chief Complaint  Patient presents with  . New Patient (Initial Visit)  . Establish Care  . Hypertension  . Hyperlipidemia  . Gastroesophageal Reflux    Subjective:   Christian Santos is a 69 y.o. male, presents to clinic with CC of the following:  Chief Complaint  Patient presents with  . New Patient (Initial Visit)  . Establish Care  . Hypertension  . Hyperlipidemia  . Gastroesophageal Reflux    HPI:  Patient is a 69 year old male whose last visit at [facility] was 10/15/17. He more recently has been followed at [facility] clinic by Dr. Baldemar Lenis.  Last visit was 11/28/2019. Next planned PE in August noted. He returns today to follow-up and return as a patient at [facility].  His recent past medical issues include: OSA: He has been compliant with his CPAP machine. He wakes up feeling well rested. He is using it approximately 6 hours per night. He thinks it has helped him feel better in particular with regard to his sinuses and his snoring. 06/28/2019 - SLEEP STUDY FINDINGS: Patient underwent a one night Home Sleep Test and by behavioral criteria, slept for approximately 6.3 hours, with a sleep latency of 6 minutes and a sleep efficiency of 90.4%. Moderate sleep disordered breathing (AHI=22) is noted based on a 4% hypopnea desaturation criteria, predominantly in the supine position (43 events/hour)andwith indications of respiratory control instability. The patient slept supine 47.9% of the night based on valid recording time of 6.33 hours and is 10.8 times as likely to have apneas/hypopneas when supine. When considering more subtle measures of sleep disordered breathing, the overall respiratory disturbance index is also moderate (RDI=29) based on a 1% hypopnea desaturation criteria with confirmation by surrogate arousal indicators. The apneas/hypopneas are accompanied by minimal  oxygen desaturation (percent time below 90% SpO2: 3.7%, Min SpO2: 82.0%). The average desaturation across all sleep disordered breathing events is 4.7%. Snoring occurs for 30.5% (30 dB) of the study, 10.2% is very loud. The mean pulse rate is 54 BPM. TREATMENT CONSIDERATIONS: 1)Nasal continuous positive airway pressure (CPAP) is the first treatment option based on the AHI severity and co-morbidities. 2)Recommend AutoPAP 5 to 20 cm H2O pressure. 3)The patient should avoid sleeping supine given non-supine AHI is in the normal range.  HTN: He is takign atenolol and amlodipine and his blood pressure has been at goal on this regimen. BP Readings from Last 3 Encounters:  01/24/20 128/64  10/15/17 126/64  07/16/17 112/80    GERD: He is taking omeprazole BID and this works well for him as long as he avoids spicy foods.Loves [place] food.  Psoriasis/Psoriatic arthritis: He is not undergoing treatment right now and seems to be doing well without it. Had seen rheum in the past. Not in a while (prior embrel and Humira). Still has arthritis in his fingers, and ball of foot. Able to function during day. No prescription med for this presently. Takes an OTC med from [place] for psoriasis and keeps well controlled. Medicare and cost an issue.   Insomnia: He is taking zolpidem every night. He falls asleep easily with this but often times wakes up early in the morning.Been on for a long time. Discussed concerns with this.  Hyperlipidemia Lab Results  Component Value Date   CHOL 108 02/27/2017   HDL 37 (L) 02/27/2017   LDLCALC 53 02/27/2017  TRIG 91 02/27/2017   CHOLHDL 2.9 02/27/2017   Lipid panel - 11/28/19 - TC-145, LDL - 74 in [facility] labs  Hemochromatosis  Last ferritin 77 in 2017 and ones in 2016 lower than this  Told not have 3 years ago by specialist  Hyperglycemia - 05/09/19 visit with prior provider - FBS - 122, a1c 5.5 on 11/28/19  Allergic Rhinitis - manages on flonase, seasonal  Screenings -  Lab  Results  Component Value Date   PSA 2.3 05/05/2017   PSA 1.48 04/15/2016  PSA - 2.54 - 04/2019  Colonoscopy - 11/25/16, two small polyps noted, rec'ed in about 5 years per patient. Friends with Dr. Jeannette Corpus, GI'ist  Hep C Ab neg in 2018 Comp panel - 11/28/19 - normal CBC 11/28/19 - ok and reviewed  Exercise - very active with work and [place] - pipe rare Alcohol - minimal  FH - M - CA intestines, D - bladder CA  Patient Active Problem List   Diagnosis Date Noted  . Obstructive sleep apnea syndrome 01/24/2020  . Arthritis with psoriasis ([place]) 01/24/2020  . Tick bite of back 02/25/2017  . Acute maxillary sinusitis 11/05/2016  . Solitary pulmonary nodule on lung CT 09/03/2016  . Annual physical exam 04/15/2016  . Screening for tuberculosis 03/13/2016  . Hyperglycemia 03/06/2016  . Localized bacterial infection of skin 03/06/2016  . Insomnia 08/28/2015  . Essential hypertension 08/28/2015  . Hyperlipidemia 08/28/2015  . Allergic rhinitis due to pollen 08/28/2015  . Hemochromatosis 05/03/2015  . Gastro-esophageal reflux disease without esophagitis 12/10/2014      Current Outpatient Medications:  .  Acetaminophen (TYLENOL ARTHRITIS PAIN PO), Take by mouth. Two in the morning and two at night, Disp: , Rfl:  .  amLODipine (NORVASC) 5 MG tablet, Take 1 tablet (5 mg total) by mouth daily., Disp: 90 tablet, Rfl: 0 .  aspirin EC 81 MG tablet, Take by mouth., Disp: , Rfl:  .  atenolol (TENORMIN) 50 MG tablet, Take 50 mg by mouth daily., Disp: , Rfl:  .  fluticasone (FLONASE) 50 MCG/ACT nasal spray, Place 2 sprays into both nostrils daily., Disp: 16 g, Rfl: 6 .  Magnesium 500 MG CAPS, Take by mouth., Disp: , Rfl:  .  magnesium gluconate (MAGONATE) 500 MG tablet, Take 1,000 mg by mouth daily., Disp: , Rfl:  .  melatonin 5 MG TABS, Take 5 mg by mouth., Disp: , Rfl:  .  Multiple Vitamins-Minerals (MULTIVITAMIN WITH MINERALS) tablet, Take 1 tablet by mouth daily., Disp: ,  Rfl:  .  omeprazole (PRILOSEC) 40 MG capsule, TAKE 1 CAPSULE BY MOUTH ONCE DAILY, Disp: 90 capsule, Rfl: 1 .  Probiotic Product (PROBIOTIC ADVANCED PO), Take by mouth., Disp: , Rfl:  .  zolpidem (AMBIEN) 10 MG tablet, Take 1 tablet (10 mg total) by mouth at bedtime as needed. for sleep, Disp: 30 tablet, Rfl: 2 .  naftifine (NAFTIN) 1 % cream, Apply topically., Disp: , Rfl:    Allergies  Allergen Reactions  . Alcohol Other (See Comments)  . Erythromycin Base   . Lisinopril Other (See Comments)    cough cough  . Other Other (See Comments)    Mycins = chest pain, [place] = skin test results. Mycins = chest pain, [place] = skin test results. Mycins = chest pain, [place] = skin test results.  Nilsa Nutting Alcohol  [Lanolin] Other (See Comments)  . Clobetasol Rash    Severe blisters Severe blisters  Severe blisters     Past Surgical History:  Procedure Laterality Date  . HAND SURGERY       Family History  Problem Relation Age of Onset  . Heart attack Father   . Heart attack Brother   . Heart disease Brother      Social History   Tobacco Use  . Smoking status: Never Smoker  . Smokeless tobacco: Never Used  Substance Use Topics  . Alcohol use: Yes    Alcohol/week: 0.0 standard drinks    With staff assistance, above reviewed with the patient today.  ROS: As noted above in HPI  No recent fevers or other Covid concerning sx's, has had the Covid vaccine x2 doses No increase headaches, vision changes, plans to get her eyes examined again in the next couple months and check his glasses prescription.  Has bilateral hearing aids and that is helpful with his hearing No CP, palpitations, increased lower extremity swelling No increased SOB,  No persistent abdominal pains  No rectal bleeding or dark/black stools, no chronic diarrhea or constipation No recent flank pains, dysuria, or frequency, no blood in the urine + for joint aches, mostly the hands, sometimes the  feet, and he does see a foot doctor who gives him a periodic injection to help. No numbness, tingling or weakness in the extremities Denies any balance issues that have increased Gets up at most 1 time a night to urinate, sometimes not at all, no marked hesitancy or urgency He did note some issues with erectile dysfunction, is able to get erections, just sometimes not as firm and sometimes difficulty with erections.  Had been on a Viagra type product to help in the distant past, not recent past.  Inquired about potentially trying that again. Denies other specific complaints on general systems review except as noted in HPI  No results found for this or any previous visit (from the past 72 hour(s)).   PHQ2/9: Depression screen [facility] 2/9 01/24/2020 10/15/2017 06/02/2017 05/05/2017 02/25/2017  Decreased Interest 0 0 0 0 0  Down, Depressed, Hopeless 0 0 0 0 0  PHQ - 2 Score 0 0 0 0 0  Altered sleeping 1 - - - -  Tired, decreased energy 3 - - - -  Change in appetite 0 - - - -  Feeling bad or failure about yourself  0 - - - -  Trouble concentrating 0 - - - -  Moving slowly or fidgety/restless 0 - - - -  Suicidal thoughts 0 - - - -  PHQ-9 Score 4 - - - -  Difficult doing work/chores Not difficult at all - - - -   PHQ-2/9 Result is neg  Fall Risk: Fall Risk  01/24/2020 10/15/2017 06/02/2017 05/05/2017 02/25/2017  Falls in the past year? 0 No No No No  Number falls in past yr: 0 - - - -  Injury with Fall? 0 - - - -      Objective:   Vitals:   01/24/20 0743  BP: 128/64  Pulse: (!) 50  Resp: 16  Temp: (!) 97.3 F (36.3 C)  TempSrc: Temporal  SpO2: 97%  Weight: 157 lb 8 oz (71.4 kg)  Height: 5\' 6"  (1.676 m)    Body mass index is 25.42 kg/m.  Physical Exam   NAD, masked, pleasant HEENT - [place]/AT, sclera anicteric, + glasses, PERRL, EOMI, conj - non-inj'ed, TM's and canals clear, + hearing aids, removed for exam,  pharynx clear Neck - supple, no adenopathy, no  TM, carotids 2+ and = without  bruits bilat Car - RRR without m/g/r Pulm- RR and effort normal at rest, CTA without wheeze or rales Abd - soft, NT, ND, BS+,  no masses, no HSM Back - no CVA tenderness Prostate - no tenderness, fluctuance, nodules on palpation, not markedly increased in size, Testes without swelling, nodules, tenderness No inguinal hernia, Skin- no rash noted on exposed areas, no concerning lesions noted on exposed areas, denied otherwise Ext - no LE edema, is status post finger amputation of the fifth digit on the right hand at the DIP joint, and also the fourth digit of the right hand past the DIP joint, no active joints on the hand today, with no marked erythema or swelling in the hand joints, good motion of the wrist joints. Neuro/psychiatric - affect was not flat, appropriate with conversation  Alert and oriented  Grossly non-focal - good strength on testing extremities, sensation intact to LT in distal extremities, DTRs 2+ and equal in the patella, Romberg was negative, no pronator drift, good balance on 1 foot, good finger-to-nose,  Speech and gait are normal   Results for orders placed or performed in visit on 08/17/19  Novel Coronavirus, NAA ([facility])   Specimen: Nasopharyngeal(NP) swabs in vial transport medium   NASOPHARYNGE  TESTING  Result Value Ref Range   SARS-CoV-2, NAA Not Detected Not Detected   Most recent labs from February 2021 were reviewed    Assessment & Plan:   1. Essential hypertension Blood pressure has been well controlled on his medication regimen presently. Continue his current medications.  2. Pure hypercholesterolemia Last lipid panel was okay, done in February 2021 He tries to eat healthy, and has done well with weight maintenance.  3. Hyperglycemia Mildly high blood sugars noted in the past, although A1c checks have been good, including recent ones in February 2021 He has no history of diabetes.  4. Insomnia, unspecified type Takes Ambien, and has for a long  time.  Discussed at length concerns with this medicine today, and also difficulty trying to stop this medicine. He will try to cut these pills in half and see how he does with a 5 mg dose over the next couple months, and he noted he sometimes takes a Benadryl product which helps some sleep as well.  Reasonable to use as trying to wean from the Ambien product.  Await his response.  5. Gastro-esophageal reflux disease without esophagitis Has been on omeprazole to control symptoms for some time, tries to help with his dietary modifications as well.  Did include some information in the AVS on this. We will continue the omeprazole presently, and over time, may try to lessen this medicine as well.  6. Hemochromatosis, unspecified hemochromatosis type Do not feel he has hemochromatosis after seeing a specialist in the past, and no need to follow-up ferritins at this point.  7. Obstructive sleep apnea syndrome Continue with his CPAP device and it is helpful.  8. Arthritis with psoriasis ([place]) As an over-the-counter product from [place] that he uses for the psoriasis, and states it is very helpful.  We will continue.  Has done well off of the medicines previously used (Enbrel and Humira), with him stopping them due to cost issues.  Continue with his current management.  9. Seasonal allergic rhinitis due to pollen Continue the Flonase product, as that is helpful.  10. Erectile dysfunction, unspecified erectile dysfunction type Reasonable to try a sildenafil product, and reviewed this with him on how  to take and when to take.  Await his response.  He had been on a similar type product like this years ago, although not in the recent past. - sildenafil (REVATIO) 20 MG tablet; Take 1-3 tabs 0.5-2 hours prior to planned sexual activity  Dispense: 30 tablet; Refill: 5  Do not feel repeating labs today as needed, as he just had a pretty exhaustive panel in February 2021. We will follow-up in approximately 4  months, sooner as needed. We will continue to follow PSAs to help in prostate screening over time and he noted he can check with Dr. Tollie Pizza as to the recommendation for his next colonoscopy, although he does believe it was recommended 5 years after the previous.      Towanda Malkin, MD 01/24/20 8:01 AM

## 2020-01-24 ENCOUNTER — Encounter: Payer: Self-pay | Admitting: Internal Medicine

## 2020-01-24 ENCOUNTER — Ambulatory Visit (INDEPENDENT_AMBULATORY_CARE_PROVIDER_SITE_OTHER): Payer: Medicare Other | Admitting: Internal Medicine

## 2020-01-24 ENCOUNTER — Other Ambulatory Visit: Payer: Self-pay

## 2020-01-24 VITALS — BP 128/64 | HR 50 | Temp 97.3°F | Resp 16 | Ht 66.0 in | Wt 157.5 lb

## 2020-01-24 DIAGNOSIS — G4733 Obstructive sleep apnea (adult) (pediatric): Secondary | ICD-10-CM

## 2020-01-24 DIAGNOSIS — R5383 Other fatigue: Secondary | ICD-10-CM

## 2020-01-24 DIAGNOSIS — G4700 Insomnia, unspecified: Secondary | ICD-10-CM

## 2020-01-24 DIAGNOSIS — J301 Allergic rhinitis due to pollen: Secondary | ICD-10-CM

## 2020-01-24 DIAGNOSIS — I10 Essential (primary) hypertension: Secondary | ICD-10-CM

## 2020-01-24 DIAGNOSIS — R739 Hyperglycemia, unspecified: Secondary | ICD-10-CM

## 2020-01-24 DIAGNOSIS — N529 Male erectile dysfunction, unspecified: Secondary | ICD-10-CM | POA: Diagnosis not present

## 2020-01-24 DIAGNOSIS — L4050 Arthropathic psoriasis, unspecified: Secondary | ICD-10-CM

## 2020-01-24 DIAGNOSIS — E7800 Pure hypercholesterolemia, unspecified: Secondary | ICD-10-CM | POA: Diagnosis not present

## 2020-01-24 DIAGNOSIS — K219 Gastro-esophageal reflux disease without esophagitis: Secondary | ICD-10-CM

## 2020-01-24 MED ORDER — SILDENAFIL CITRATE 20 MG PO TABS: ORAL_TABLET | ORAL | 5 refills | Status: DC

## 2020-01-24 NOTE — Patient Instructions (Signed)

## 2020-02-13 DIAGNOSIS — H25813 Combined forms of age-related cataract, bilateral: Secondary | ICD-10-CM | POA: Diagnosis not present

## 2020-03-01 ENCOUNTER — Ambulatory Visit: Payer: Medicare Other

## 2020-03-01 ENCOUNTER — Other Ambulatory Visit: Payer: Self-pay

## 2020-03-08 ENCOUNTER — Ambulatory Visit (INDEPENDENT_AMBULATORY_CARE_PROVIDER_SITE_OTHER): Payer: Medicare Other

## 2020-03-08 ENCOUNTER — Other Ambulatory Visit: Payer: Self-pay

## 2020-03-08 VITALS — BP 130/68 | HR 54 | Temp 98.5°F | Ht 66.0 in | Wt 157.0 lb

## 2020-03-08 DIAGNOSIS — Z0000 Encounter for general adult medical examination without abnormal findings: Secondary | ICD-10-CM

## 2020-03-08 NOTE — Progress Notes (Addendum)
Subjective:   Christian Santos is a 69 y.o. male who presents for Medicare Annual/Subsequent preventive examination.  No charge for this encounter due to patient's last AWV completed by previous PCP at [facility] on 05/09/19. Encounter note for documentation and patient reference only. Visit will show canceled in appointment desk.   Virtual Visit via Video Note  I connected with  Christian Santos on 03/08/20 at  8:40 AM EDT via telehealth video enabled device and verified that I am speaking with the correct person using two identifiers.  Medicare Annual Wellness visit completed vitually due to Covid-19 pandemic.   Location: Patient: home Provider: office   I discussed the limitations, risks, security and privacy concerns of performing an evaluation and management service by telephone and the availability of in person appointments. The patient expressed understanding and agreed to proceed.  Some vital signs may be absent or patient reported.   Clemetine Marker, LPN    Review of Systems:   Cardiac Risk Factors include: advanced age (>79men, >74 women);dyslipidemia;male gender;hypertension     Objective:    Vitals: BP 130/68   Pulse (!) 54   Temp 98.5 F (36.9 C)   Ht 5\' 6"  (1.676 m)   Wt 157 lb (71.2 kg)   BMI 25.34 kg/m   Body mass index is 25.34 kg/m.  Advanced Directives 03/08/2020 06/04/2017 06/02/2017 05/05/2017 02/25/2017 02/03/2017 11/05/2016  Does Patient Have a Medical Advance Directive? Yes No No No No No No  Type of Paramedic of [place];Living will - - - - - -  Does patient want to make changes to medical advance directive? - - - - - - -  Copy of [place] in Chart? No - copy requested - - - - - -    Tobacco Social History   Tobacco Use  Smoking Status Never Smoker  Smokeless Tobacco Never Used     Counseling given: Not Answered   Clinical Intake:  Pre-visit preparation completed: Yes  Pain : No/denies  pain     BMI - recorded: 25.34 Nutritional Status: BMI 25 -29 Overweight Nutritional Risks: None Diabetes: No  How often do you need to have someone help you when you read instructions, pamphlets, or other written materials from your doctor or pharmacy?: 1 - Never  Interpreter Needed?: No  Information entered by :: Clemetine Marker LPN  Past Medical History:  Diagnosis Date   GERD (gastroesophageal reflux disease)    Hyperlipidemia    Hypertension    Insomnia    Psoriasis    Past Surgical History:  Procedure Laterality Date   HAND SURGERY     Family History  Problem Relation Age of Onset   Heart attack Father    Heart attack Brother    Heart disease Brother    Social History   Socioeconomic History   Marital status: Married    Spouse name: Not on file   Number of children: 3   Years of education: Not on file   Highest education level: Not on file  Occupational History   Not on file  Tobacco Use   Smoking status: Never Smoker   Smokeless tobacco: Never Used  Substance and Sexual Activity   Alcohol use: Yes    Alcohol/week: 0.0 standard drinks   Drug use: No   Sexual activity: Yes    Partners: Female  Other Topics Concern   Not on file  Social History Narrative   Not on file  Social Determinants of Health   Financial Resource Strain: Low Risk    Difficulty of Paying Living Expenses: Not hard at all  Food Insecurity: No Food Insecurity   Worried About Charity fundraiser in the Last Year: Never true   [place] in the Last Year: Never true  Transportation Needs: No Transportation Needs   Lack of Transportation (Medical): No   Lack of Transportation (Non-Medical): No  Physical Activity: Sufficiently Active   Days of Exercise per Week: 7 days   Minutes of Exercise per Session: 60 min  Stress: No Stress Concern Present   Feeling of Stress : Not at all  Social Connections: Unknown   Frequency of Communication with Friends and Family: Patient refused    Frequency of Social Gatherings with Friends and Family: Patient refused   Attends Religious Services: Patient refused   Active Member of Clubs or Organizations: Patient refused   Attends Archivist Meetings: Patient refused   Marital Status: Married    Outpatient Encounter Medications as of 03/08/2020  Medication Sig   Acetaminophen (TYLENOL ARTHRITIS PAIN PO) Take by mouth. Two in the morning and two at night   amLODipine (NORVASC) 5 MG tablet Take 1 tablet (5 mg total) by mouth daily.   aspirin EC 81 MG tablet Take by mouth.   atenolol (TENORMIN) 50 MG tablet Take 50 mg by mouth daily.   fluticasone (FLONASE) 50 MCG/ACT nasal spray Place 2 sprays into both nostrils daily.   Magnesium 500 MG CAPS Take by mouth.   melatonin 5 MG TABS Take 5 mg by mouth.   Multiple Vitamins-Minerals (MULTIVITAMIN WITH MINERALS) tablet Take 1 tablet by mouth daily.   omeprazole (PRILOSEC) 40 MG capsule TAKE 1 CAPSULE BY MOUTH ONCE DAILY   Probiotic Product (PROBIOTIC ADVANCED PO) Take by mouth.   UNABLE TO FIND Take by mouth. equilac 1200 mg for psoriasis   zolpidem (AMBIEN) 10 MG tablet Take 1 tablet (10 mg total) by mouth at bedtime as needed. for sleep   [DISCONTINUED] MISC NATURAL PRODUCTS PO Take 1,200 mg by mouth. For psoriasis   [DISCONTINUED] sildenafil (REVATIO) 20 MG tablet Take 1-3 tabs 0.5-2 hours prior to planned sexual activity   [DISCONTINUED] magnesium gluconate (MAGONATE) 500 MG tablet Take 1,000 mg by mouth daily.   [DISCONTINUED] naftifine (NAFTIN) 1 % cream Apply topically.   No facility-administered encounter medications on file as of 03/08/2020.    Activities of Daily Living In your present state of health, do you have any difficulty performing the following activities: 03/08/2020 01/24/2020  Hearing? Y Y  Comment declines hearing aids -  Vision? N N  Difficulty concentrating or making decisions? N Y  Walking or climbing stairs? N N  Dressing or bathing? N N  Doing  errands, shopping? N N  Preparing Food and eating ? N -  Using the Toilet? N -  In the past six months, have you accidently leaked urine? N -  Do you have problems with loss of bowel control? N -  Managing your Medications? N -  Managing your Finances? N -  Housekeeping or managing your Housekeeping? N -  Some recent data might be hidden    Patient Care Team: Towanda Malkin, MD as PCP - General (Internal Medicine)   Assessment:   This is a routine wellness examination for [employer].  Exercise Activities and Dietary recommendations Current Exercise Habits: The patient has a physically strenuous job, but has no regular exercise apart from  work., Exercise limited by: None identified  Goals      Patient Stated     Patient would like to work less and hopefully retire        Fall Risk Fall Risk  03/08/2020 01/24/2020 10/15/2017 06/02/2017 05/05/2017  Falls in the past year? 0 0 No No No  Number falls in past yr: 0 0 - - -  Injury with Fall? 0 0 - - -  Risk for fall due to : No Fall Risks - - - -  Follow up Falls prevention discussed - - - -   FALL RISK PREVENTION PERTAINING TO THE HOME:  Any stairs in or around the home? Yes  If so, are there any without handrails? Yes   Home free of loose throw rugs in walkways, pet beds, electrical cords, etc? Yes  Adequate lighting in your home to reduce risk of falls? Yes   ASSISTIVE DEVICES UTILIZED TO PREVENT FALLS:  Life alert? No  Use of a cane, walker or w/c? No  Grab bars in the bathroom? No  Shower chair or bench in shower? No  Elevated toilet seat or a handicapped toilet? No   DME ORDERS:  DME order needed?  No   TIMED UP AND GO:  Was the test performed? No . Telephonic visit.   Education: Fall risk prevention has been discussed.  Intervention(s) required? No    DME/home health order needed?  No   Depression Screen PHQ 2/9 Scores 03/08/2020 01/24/2020 10/15/2017 06/02/2017  PHQ - 2 Score 0 0 0 0  PHQ- 9 Score - 4  - -    Cognitive Function     6CIT Screen 03/08/2020  What Year? 0 points  What month? 3 points  What time? 0 points  Count back from 20 0 points  Months in reverse 4 points  Repeat phrase 0 points  Total Score 7    Immunization History  Administered Date(s) Administered   Influenza, High Dose Seasonal PF 07/16/2017   Influenza,inj,Quad PF,6+ Mos 08/28/2015   Influenza-Unspecified 07/08/2016, 09/11/2018   PFIZER SARS-COV-2 Vaccination 12/01/2019, 12/22/2019   Pneumococcal Polysaccharide-23 01/11/2014   Td 06/04/2017   Zoster 01/05/2011   Zoster Recombinat (Shingrix) 06/05/2018, 10/18/2018    Qualifies for Shingles Vaccine? Up to date  Tdap: Up to date  Flu Vaccine: Due for Flu vaccine. Does the patient want to receive this vaccine today?  No . Education has been provided regarding the importance of this vaccine but still declined. Advised may receive this vaccine at local pharmacy or Health Dept. Aware to provide a copy of the vaccination record if obtained from local pharmacy or Health Dept. Verbalized acceptance and understanding.  Pneumococcal Vaccine: Due for Pneumococcal vaccine. Does the patient want to receive this vaccine today?  No . Education has been provided regarding the importance of this vaccine but still declined. Advised may receive this vaccine at local pharmacy or Health Dept. Aware to provide a copy of the vaccination record if obtained from local pharmacy or Health Dept. Verbalized acceptance and understanding.  Covid-19 Vaccine: Up to date   Screening Tests Health Maintenance  Topic Date Due   PNA vac Low Risk Adult (1 of 2 - PCV13) 06/06/2016   INFLUENZA VACCINE  05/06/2020   COLONOSCOPY  11/25/2021   TETANUS/TDAP  06/05/2027   COVID-19 Vaccine  Completed   Hepatitis C Screening  Completed   Cancer Screenings:  Colorectal Screening: Completed 11/25/16. Repeat every 5 years;   Lung Cancer Screening: (Low  Dose CT Chest recommended if Age 13-80  years, 30 pack-year currently smoking OR have quit w/in 15years.) does not qualify.    Additional Screening:  Hepatitis C Screening: does qualify; Completed 05/05/17  Vision Screening: Recommended annual ophthalmology exams for early detection of glaucoma and other disorders of the eye. Is the patient up to date with their annual eye exam?  Yes  Who is the provider or what is the name of the office in which the pt attends annual eye exams? Dr. Carmelia Roller  Dental Screening: Recommended annual dental exams for proper oral hygiene  Community Resource Referral:  CRR required this visit?  No       Plan:    I have personally reviewed and addressed the Medicare Annual Wellness questionnaire and have noted the following in the patient's chart:  A. Medical and social history B. Use of alcohol, tobacco or illicit drugs  C. Current medications and supplements D. Functional ability and status E.  Nutritional status F.  Physical activity G. Advance directives H. List of other physicians I.  Hospitalizations, surgeries, and ER visits in previous 12 months J.  [place] such as hearing and vision if needed, cognitive and depression L. Referrals and appointments   In addition, I have reviewed and discussed with patient certain preventive protocols, quality metrics, and best practice recommendations. A written personalized care plan for preventive services as well as general preventive health recommendations were provided to patient.   Signed,  Clemetine Marker, LPN Nurse Health Advisor   Nurse Notes:

## 2020-03-08 NOTE — Patient Instructions (Signed)
Mr. Christian Santos , Thank you for taking time to come for your Medicare Wellness Visit. I appreciate your ongoing commitment to your health goals. Please review the following plan we discussed and let me know if I can assist you in the future.   Screening recommendations/referrals: Colonoscopy: done 11/25/16 Recommended yearly ophthalmology/optometry visit for glaucoma screening and checkup Recommended yearly dental visit for hygiene and checkup  Vaccinations: Influenza vaccine: postponed Pneumococcal vaccine: done 01/11/14 due for PCV13 Tdap vaccine: done 06/04/17 Shingles vaccine: done 06/05/18 & 10/18/18 Covid-19: done 12/01/19 & 12/22/19  Advanced directives: Please bring a copy of your health care power of attorney and living will to the office at your convenience.  Conditions/risks identified: Keep up the great work!  Next appointment: Follow up in one year for your annual wellness visit.   Preventive Care 69 Years and Older, Male Preventive care refers to lifestyle choices and visits with your health care provider that can promote health and wellness. What does preventive care include?  A yearly physical exam. This is also called an annual well check.  Dental exams once or twice a year.  Routine eye exams. Ask your health care provider how often you should have your eyes checked.  Personal lifestyle choices, including:  Daily care of your teeth and gums.  Regular physical activity.  Eating a healthy diet.  Avoiding tobacco and drug use.  Limiting alcohol use.  Practicing safe sex.  Taking low doses of aspirin every day.  Taking vitamin and mineral supplements as recommended by your health care provider. What happens during an annual well check? The services and screenings done by your health care provider during your annual well check will depend on your age, overall health, lifestyle risk factors, and family history of disease. Counseling  Your health care provider may  ask you questions about your:  Alcohol use.  Tobacco use.  Drug use.  Emotional well-being.  Home and relationship well-being.  Sexual activity.  Eating habits.  History of falls.  Memory and ability to understand (cognition).  Work and work Statistician. Screening  You may have the following tests or measurements:  Height, weight, and BMI.  Blood pressure.  Lipid and cholesterol levels. These may be checked every 5 years, or more frequently if you are over 39 years old.  Skin check.  Lung cancer screening. You may have this screening every year starting at age 69 if you have a 30-pack-year history of smoking and currently smoke or have quit within the past 15 years.  Fecal occult blood test (FOBT) of the stool. You may have this test every year starting at age 69.  Flexible sigmoidoscopy or colonoscopy. You may have a sigmoidoscopy every 5 years or a colonoscopy every 10 years starting at age 69.  Prostate cancer screening. Recommendations will vary depending on your family history and other risks.  Hepatitis C blood test.  Hepatitis B blood test.  Sexually transmitted disease (STD) testing.  Diabetes screening. This is done by checking your blood sugar (glucose) after you have not eaten for a while (fasting). You may have this done every 1-3 years.  Abdominal aortic aneurysm (AAA) screening. You may need this if you are a current or former smoker.  Osteoporosis. You may be screened starting at age 69 if you are at high risk. Talk with your health care provider about your test results, treatment options, and if necessary, the need for more tests. Vaccines  Your health care provider may recommend certain vaccines, such  as:  Influenza vaccine. This is recommended every year.  Tetanus, diphtheria, and acellular pertussis (Tdap, Td) vaccine. You may need a Td booster every 10 years.  Zoster vaccine. You may need this after age 69.  Pneumococcal 13-valent  conjugate (PCV13) vaccine. One dose is recommended after age 69.  Pneumococcal polysaccharide (PPSV23) vaccine. One dose is recommended after age 69. Talk to your health care provider about which screenings and vaccines you need and how often you need them. This information is not intended to replace advice given to you by your health care provider. Make sure you discuss any questions you have with your health care provider. Document Released: 10/19/2015 Document Revised: 06/11/2016 Document Reviewed: 07/24/2015 Elsevier Interactive Patient Education  2017 [place] Prevention in the Home Falls can cause injuries. They can happen to people of all ages. There are many things you can do to make your home safe and to help prevent falls. What can I do on the outside of my home?  Regularly fix the edges of walkways and driveways and fix any cracks.  Remove anything that might make you trip as you walk through a door, such as a raised step or threshold.  Trim any bushes or trees on the path to your home.  Use bright outdoor lighting.  Clear any walking paths of anything that might make someone trip, such as rocks or tools.  Regularly check to see if handrails are loose or broken. Make sure that both sides of any steps have handrails.  Any raised decks and porches should have guardrails on the edges.  Have any leaves, snow, or ice cleared regularly.  Use sand or salt on walking paths during winter.  Clean up any spills in your garage right away. This includes oil or grease spills. What can I do in the bathroom?  Use night lights.  Install grab bars by the toilet and in the tub and shower. Do not use towel bars as grab bars.  Use non-skid mats or decals in the tub or shower.  If you need to sit down in the shower, use a plastic, non-slip stool.  Keep the floor dry. Clean up any water that spills on the floor as soon as it happens.  Remove soap buildup in the tub or  shower regularly.  Attach bath mats securely with double-sided non-slip rug tape.  Do not have throw rugs and other things on the floor that can make you trip. What can I do in the bedroom?  Use night lights.  Make sure that you have a light by your bed that is easy to reach.  Do not use any sheets or blankets that are too big for your bed. They should not hang down onto the floor.  Have a firm chair that has side arms. You can use this for support while you get dressed.  Do not have throw rugs and other things on the floor that can make you trip. What can I do in the kitchen?  Clean up any spills right away.  Avoid walking on wet floors.  Keep items that you use a lot in easy-to-reach places.  If you need to reach something above you, use a strong step stool that has a grab bar.  Keep electrical cords out of the way.  Do not use floor polish or wax that makes floors slippery. If you must use wax, use non-skid floor wax.  Do not have throw rugs and other things on the  floor that can make you trip. What can I do with my stairs?  Do not leave any items on the stairs.  Make sure that there are handrails on both sides of the stairs and use them. Fix handrails that are broken or loose. Make sure that handrails are as long as the stairways.  Check any carpeting to make sure that it is firmly attached to the stairs. Fix any carpet that is loose or worn.  Avoid having throw rugs at the top or bottom of the stairs. If you do have throw rugs, attach them to the floor with carpet tape.  Make sure that you have a light switch at the top of the stairs and the bottom of the stairs. If you do not have them, ask someone to add them for you. What else can I do to help prevent falls?  Wear shoes that:  Do not have high heels.  Have rubber bottoms.  Are comfortable and fit you well.  Are closed at the toe. Do not wear sandals.  If you use a stepladder:  Make sure that it is fully  opened. Do not climb a closed stepladder.  Make sure that both sides of the stepladder are locked into place.  Ask someone to hold it for you, if possible.  Clearly mark and make sure that you can see:  Any grab bars or handrails.  First and last steps.  Where the edge of each step is.  Use tools that help you move around (mobility aids) if they are needed. These include:  Canes.  Walkers.  Scooters.  Crutches.  Turn on the lights when you go into a dark area. Replace any light bulbs as soon as they burn out.  Set up your furniture so you have a clear path. Avoid moving your furniture around.  If any of your floors are uneven, fix them.  If there are any pets around you, be aware of where they are.  Review your medicines with your doctor. Some medicines can make you feel dizzy. This can increase your chance of falling. Ask your doctor what other things that you can do to help prevent falls. This information is not intended to replace advice given to you by your health care provider. Make sure you discuss any questions you have with your health care provider. Document Released: 07/19/2009 Document Revised: 02/28/2016 Document Reviewed: 10/27/2014 Elsevier Interactive Patient Education  2017 [employer].

## 2020-05-10 ENCOUNTER — Ambulatory Visit: Payer: Medicare Other

## 2020-05-11 DIAGNOSIS — M5412 Radiculopathy, cervical region: Secondary | ICD-10-CM | POA: Diagnosis not present

## 2020-05-11 DIAGNOSIS — M542 Cervicalgia: Secondary | ICD-10-CM | POA: Diagnosis not present

## 2020-05-21 DIAGNOSIS — M4712 Other spondylosis with myelopathy, cervical region: Secondary | ICD-10-CM | POA: Diagnosis not present

## 2020-05-23 DIAGNOSIS — K635 Polyp of colon: Secondary | ICD-10-CM | POA: Insufficient documentation

## 2020-05-23 NOTE — Progress Notes (Signed)
Chief Complaint  Patient presents with  . Follow-up    patient has succesfully stopped his zolpidem at night and is doing well    HPI:  Patient is a 69 year old male My last visit with him was in April 2021, when he returned to establish with the practice Follows up today.  OSA: He remains compliant with his CPAP machine. He wakes up feeling well rested. He is using it approximately 6 hours per night. He thinks it has helped him feel better in particular with regard to his snoring.  06/28/2019 -sleep study completed: Patient underwent a one night Home Sleep Test and by behavioral criteria TREATMENT CONSIDERATIONS: 1)Nasal continuous positive airway pressure (CPAP) is the first treatment option based on the AHI severity and co-morbidities. 2)Recommend AutoPAP 5 to 20 cm H2O pressure. 3)The patient should avoid sleeping supine given non-supine AHI is in the normal range.  HTN:  Medication regimen - atenolol, amlodipine  BP Readings from Last 3 Encounters:  05/25/20 120/74  03/08/20 130/68  01/24/20 128/64   Denies chest pains, palpitations, shortness of breath, increased lower extremity swelling, headaches, has a new prescription for his glasses that he needs to get filled.  GERD:  Medication regimen -omeprazole 40 mg every morning  Noted this works well for him as long as he avoids spicy foods, noted previously he loves [place] food.  Noted today trying to lessen this over time as a goal, and reviewed concerns with chronic PPI use that have arisen in the more recent past. Also reviewed reflux precautions and information provided in the AVS for this  Psoriasis/Psoriatic arthritis:  Medication regimen-presently none  Had seen rheum in the past.Not in a while (prior embrel and Humira). Still has arthritis in his fingers, and ball of foot. Able to function during day and not very limiting. Takes an OTC med from [place] (equilac 1200mg ) for psoriasis and keeps well controlled. Medicare and  cost an issue. Also takes Tylenol arthritis 8-hour - 650 mg, 2 in the morning and 2 at night.  He saw [facility] a couple weeks back, as was having more neck symptoms, and was told he has neck arthritis.  PT was prescribed and he has that appointment on Monday.  Also prescribed Robaxin 750 mg 3 times daily and tramadol 50 mg every 8 hours as needed with 1 of those pills left presently. He noted it does feel better today. He did bring a copy of the x-rays on a sheet of paper to share.  Insomnia: Medication regimen - melatonin-5 mg at night, sometimes half of Benadryl nightly He noted he was able to successfully stop the Ambien since our last visit. Doing well with the above regimen.  Hyperlipidemia Medication regimen-none  Recent Labs       Lab Results  Component Value Date   CHOL 108 02/27/2017   HDL 37 (L) 02/27/2017   LDLCALC 53 02/27/2017   TRIG 91 02/27/2017   CHOLHDL 2.9 02/27/2017     Noted doing well trying to eat a healthier diet. Lipid panel - 11/28/19 - TC-145, LDL - 74in [facility]  Hyperglycemia - 05/09/19 visit with prior provider - FBS -122,  A1c -  5.5 on 11/28/19  Allergic Rhinitis - manages on flonase, seasonal  Prostate cancer screening/ED A sildenafil product was prescribed last visit to use as needed, and was on a similar type product many years ago.  Recent Labs       Lab Results  Component Value Date   PSA  2.3 05/05/2017   PSA 1.48 04/15/2016    PSA - 2.54 - 04/2019  Colon cancer screening -had colonoscopy- 11/25/16, two small polyps noted, rec'ed in about 5 years per patient.  He noted he is friends with Dr. Jeannette Corpus, GI'ist   Comp panel - 11/28/19 - normal CBC 11/28/19 - okand reviewed  Wt Readings from Last 3 Encounters:  05/25/20 165 lb 4.8 oz (75 kg)  03/08/20 157 lb (71.2 kg)  01/24/20 157 lb 8 oz (71.4 kg)     Has been successful with weight loss in the recent past, with weight increased some since the  last visit in June, approximately 8 pounds. Diet-tries to eat a healthy diet Exercise - very active with work and [place] - pipe rare Alcohol - minimal       Patient Active Problem List   Diagnosis Date Noted  . Polyp of colon 05/23/2020  . Obstructive sleep apnea syndrome 01/24/2020  . Arthritis with psoriasis ([place]) 01/24/2020  . Erectile dysfunction 01/24/2020  . Tick bite of back 02/25/2017  . Acute maxillary sinusitis 11/05/2016  . Solitary pulmonary nodule on lung CT 09/03/2016  . Annual physical exam 04/15/2016  . Screening for tuberculosis 03/13/2016  . Hyperglycemia 03/06/2016  . Localized bacterial infection of skin 03/06/2016  . Insomnia 08/28/2015  . Essential hypertension 08/28/2015  . Mixed hyperlipidemia 08/28/2015  . Allergic rhinitis due to pollen 08/28/2015  . Hemochromatosis 05/03/2015  . Gastro-esophageal reflux disease without esophagitis 12/10/2014      Current Outpatient Medications: He brought a list today of the medications which was very helpful .  Acetaminophen (TYLENOL ARTHRITIS PAIN PO), Take by mouth. Two in the morning and two at night, Disp: , Rfl:  .  amLODipine (NORVASC) 5 MG tablet, Take 1 tablet (5 mg total) by mouth daily., Disp: 90 tablet, Rfl: 0 .  aspirin EC 81 MG tablet, Take by mouth., Disp: , Rfl:  .  atenolol (TENORMIN) 50 MG tablet, Take 50 mg by mouth daily., Disp: , Rfl:  .  Magnesium 500 MG CAPS, Take by mouth., Disp: , Rfl:  .  melatonin 5 MG TABS, Take 5 mg by mouth., Disp: , Rfl:  .  Multiple Vitamins-Minerals (MULTIVITAMIN WITH MINERALS) tablet, Take 1 tablet by mouth daily., Disp: , Rfl:  .  omeprazole (PRILOSEC) 40 MG capsule, TAKE 1 CAPSULE BY MOUTH ONCE DAILY, Disp: 90 capsule, Rfl: 1 .  Probiotic Product (PROBIOTIC ADVANCED PO), Take by mouth., Disp: , Rfl:  .  UNABLE TO FIND, Take by mouth. equilac 1200 mg for psoriasis, Disp: , Rfl:  .  fluticasone (FLONASE) 50 MCG/ACT nasal spray, Place 2 sprays  into both nostrils daily., Disp: 16 g, Rfl: 6        Allergies  Allergen Reactions  . Alcohol Other (See Comments)  . Erythromycin Base   . Lisinopril Other (See Comments)    cough cough  . Other Other (See Comments)    Mycins = chest pain, [place] = skin test results. Mycins = chest pain, [place] = skin test results. Mycins = chest pain, [place] = skin test results.  Nilsa Nutting Alcohol  [Lanolin] Other (See Comments)  . Clobetasol Rash    Severe blisters Severe blisters Severe blisters          Past Surgical History:  Procedure Laterality Date  . HAND SURGERY            Family History  Problem Relation Age of Onset  . Heart attack Father   . Heart attack Brother   . Heart disease Brother      Social History        Tobacco Use  . Smoking status: Never Smoker  . Smokeless tobacco: Never Used  Substance Use Topics  . Alcohol use: Yes    Alcohol/week: 0.0 standard drinks    With staff assistance, above reviewed with the patient today.  ROS: As per HPI, otherwise no specific complaints on a limited and focused system review    Depression screen [facility] 2/9 03/08/2020 01/24/2020 10/15/2017 06/02/2017 05/05/2017  Decreased Interest 0 0 0 0 0  Down, Depressed, Hopeless 0 0 0 0 0  PHQ - 2 Score 0 0 0 0 0  Altered sleeping - 1 - - -  Tired, decreased energy - 3 - - -  Change in appetite - 0 - - -  Feeling bad or failure about yourself  - 0 - - -  Trouble concentrating - 0 - - -  Moving slowly or fidgety/restless - 0 - - -  Suicidal thoughts - 0 - - -  PHQ-9 Score - 4 - - -  Difficult doing work/chores - Not difficult at all - - -     Fall Risk: Fall Risk  03/08/2020 01/24/2020 10/15/2017 06/02/2017 05/05/2017  Falls in the past year? 0 0 No No No  Number falls in past yr: 0 0 - - -  Injury with Fall? 0 0 - - -  Risk for fall due to : No Fall Risks - - - -  Follow up Falls prevention discussed - - - -      Objective:       Vitals:   05/25/20 0756  BP: 120/74  Pulse: 76  Resp: 16  Temp: 98.1 F (36.7 C)  TempSrc: Oral  SpO2: 99%  Weight: 165 lb 4.8 oz (75 kg)  Height: 5\' 6"  (1.676 m)    Body mass index is 26.68 kg/m.  Physical Exam    NAD, masked, pleasant HEENT - [place]/AT, sclera anicteric,+ glasses,PERRL, EOMI, conj - non-inj'ed, + hearing aids,pharynx clear Neck - supple, no adenopathy, carotids 2+ and = without bruits bilat, motion adequate today of the neck without significant discomfort Car - RRR without m/g/r Pulm- RR and effort normal at rest, CTA without wheeze or rales Abd - soft, NT diffusely, protuberant,  Back - no CVA tenderness Skin- no rash notedon exposed areas, Ext - no LE edema,is status post finger amputation of the fifth digit on the right handatthe DIP joint, and also the fourth digit of the right hand past the DIP joint,no active joints on the hand Neuro/psychiatric - affect was not flat, appropriate with conversation Alert and oriented Grossly non-focal - good strength on testing extremities, sensation intact to LT in distal extremities, Speech normal        Results for orders placed or performed in visit on 08/17/19  Novel Coronavirus, NAA ([facility])   Specimen: Nasopharyngeal(NP) swabs in vial transport medium   NASOPHARYNGE  TESTING  Result Value Ref Range   SARS-CoV-2, NAA Not Detected Not Detected   Again reviewed his last labs from February 2021 in the [facility] system    Assessment & Plan:    1. Essential hypertension Blood pressure remains well controlled on his current medication regimen We will check a BMP today Continue current medications  2. Mixed hyperlipidemia/overweight Last lipid panel reviewed, from February 2021,  LDL  was 48 Continue with the dietary modifications to help, and staying active important. We will recheck again on follow-up in February. Discussed the importance of  healthy weight maintenance, and trying to ensure not continuing to gain weight over time.  He noted he has been able to keep his weight fairly stable over time, and the importance of that in the near future emphasized today.  3. Obstructive sleep apnea syndrome Continue CPAP.  Doing well  4. Arthritis with psoriasis (HCC)/recent neck arthritis concern Has PT scheduled early next week, and did see [facility] in early August with x-rays done as part of that evaluation. Will be stopping the tramadol, and has a muscle relaxer to help manage and also takes the Tylenol arthritis routinely. Await PT input as well, and follow-up with [facility] as recommended, and as needed in the future  5. Insomnia, unspecified type Congratulated on stopping the Ambien product. Can continue the melatonin type product, and occasionally uses a half of Benadryl to help with sleep, and do feel these are much safer entities than the Ambien.  6. Gastro-esophageal reflux disease without esophagitis Continue the PPI presently Noted today trying to lessen this over time as a goal, and reviewed concerns with chronic PPI use that have arisen in the more recent past. Also reviewed reflux precautions and information provided in the AVS for this  7. Seasonal allergic rhinitis due to pollen Stable  8. Erectile dysfunction, unspecified erectile dysfunction type Did prescribe the sildenafil last visit to have as needed, although has not been using.  9. Polyp of colon, unspecified part of colon, unspecified type He had no further input on this, and does believe a follow-up was recommended 5 years after his last one in 2018.  We will continue with that plan.  (although may need sooner if clinical concerns arise)  Discussed the importance of him getting a flu shot here in the fall, and we do not have them yet in the office to give today.  He noted he would do so.  He did have the Covid vaccine.  Await results of BMP  today, and will follow up in February and recheck further labs at that point, follow-up sooner as needed.  Of note, as I was working with him during the visit on the note, it was lost completely, and was able to salvage some of the note that then was completed after his visit.    Faustino Congress, MD

## 2020-05-25 ENCOUNTER — Encounter: Payer: Self-pay | Admitting: Internal Medicine

## 2020-05-25 ENCOUNTER — Ambulatory Visit (INDEPENDENT_AMBULATORY_CARE_PROVIDER_SITE_OTHER): Payer: Medicare Other | Admitting: Internal Medicine

## 2020-05-25 ENCOUNTER — Other Ambulatory Visit: Payer: Self-pay

## 2020-05-25 VITALS — BP 120/74 | HR 76 | Temp 98.1°F | Resp 16 | Ht 66.0 in | Wt 165.3 lb

## 2020-05-25 DIAGNOSIS — K635 Polyp of colon: Secondary | ICD-10-CM

## 2020-05-25 DIAGNOSIS — K219 Gastro-esophageal reflux disease without esophagitis: Secondary | ICD-10-CM

## 2020-05-25 DIAGNOSIS — L4050 Arthropathic psoriasis, unspecified: Secondary | ICD-10-CM

## 2020-05-25 DIAGNOSIS — G4733 Obstructive sleep apnea (adult) (pediatric): Secondary | ICD-10-CM

## 2020-05-25 DIAGNOSIS — E663 Overweight: Secondary | ICD-10-CM | POA: Diagnosis not present

## 2020-05-25 DIAGNOSIS — J301 Allergic rhinitis due to pollen: Secondary | ICD-10-CM

## 2020-05-25 DIAGNOSIS — E782 Mixed hyperlipidemia: Secondary | ICD-10-CM | POA: Diagnosis not present

## 2020-05-25 DIAGNOSIS — N529 Male erectile dysfunction, unspecified: Secondary | ICD-10-CM

## 2020-05-25 DIAGNOSIS — G4700 Insomnia, unspecified: Secondary | ICD-10-CM | POA: Diagnosis not present

## 2020-05-25 DIAGNOSIS — I10 Essential (primary) hypertension: Secondary | ICD-10-CM | POA: Diagnosis not present

## 2020-05-25 LAB — BASIC METABOLIC PANEL WITH GFR
BUN: 24 mg/dL (ref 7–25)
CO2: 25 mmol/L (ref 20–32)
Calcium: 9.9 mg/dL (ref 8.6–10.3)
Chloride: 103 mmol/L (ref 98–110)
Creat: 1.04 mg/dL (ref 0.70–1.25)
GFR, Est African American: 85 mL/min/{1.73_m2} (ref >=60)
GFR, Est Non African American: 73 mL/min/{1.73_m2} (ref >=60)
Glucose, Bld: 114 mg/dL (ref 65–139)
Potassium: 4.6 mmol/L (ref 3.5–5.3)
Sodium: 138 mmol/L (ref 135–146)

## 2020-05-25 NOTE — Patient Instructions (Signed)

## 2020-05-28 ENCOUNTER — Ambulatory Visit (INDEPENDENT_AMBULATORY_CARE_PROVIDER_SITE_OTHER): Payer: Medicare Other | Admitting: Dermatology

## 2020-05-28 ENCOUNTER — Other Ambulatory Visit: Payer: Self-pay

## 2020-05-28 DIAGNOSIS — D1800 Hemangioma unspecified site: Secondary | ICD-10-CM

## 2020-05-28 DIAGNOSIS — L409 Psoriasis, unspecified: Secondary | ICD-10-CM | POA: Diagnosis not present

## 2020-05-28 DIAGNOSIS — C44319 Basal cell carcinoma of skin of other parts of face: Secondary | ICD-10-CM | POA: Diagnosis not present

## 2020-05-28 DIAGNOSIS — L814 Other melanin hyperpigmentation: Secondary | ICD-10-CM

## 2020-05-28 DIAGNOSIS — M4712 Other spondylosis with myelopathy, cervical region: Secondary | ICD-10-CM | POA: Diagnosis not present

## 2020-05-28 DIAGNOSIS — Z1283 Encounter for screening for malignant neoplasm of skin: Secondary | ICD-10-CM | POA: Diagnosis not present

## 2020-05-28 DIAGNOSIS — C44310 Basal cell carcinoma of skin of unspecified parts of face: Secondary | ICD-10-CM

## 2020-05-28 DIAGNOSIS — L570 Actinic keratosis: Secondary | ICD-10-CM

## 2020-05-28 DIAGNOSIS — D485 Neoplasm of uncertain behavior of skin: Secondary | ICD-10-CM

## 2020-05-28 DIAGNOSIS — L578 Other skin changes due to chronic exposure to nonionizing radiation: Secondary | ICD-10-CM | POA: Diagnosis not present

## 2020-05-28 DIAGNOSIS — Z9889 Other specified postprocedural states: Secondary | ICD-10-CM

## 2020-05-28 DIAGNOSIS — D229 Melanocytic nevi, unspecified: Secondary | ICD-10-CM

## 2020-05-28 DIAGNOSIS — L821 Other seborrheic keratosis: Secondary | ICD-10-CM

## 2020-05-28 DIAGNOSIS — M542 Cervicalgia: Secondary | ICD-10-CM | POA: Diagnosis not present

## 2020-05-28 DIAGNOSIS — D235 Other benign neoplasm of skin of trunk: Secondary | ICD-10-CM

## 2020-05-28 DIAGNOSIS — D239 Other benign neoplasm of skin, unspecified: Secondary | ICD-10-CM

## 2020-05-28 DIAGNOSIS — C4491 Basal cell carcinoma of skin, unspecified: Secondary | ICD-10-CM

## 2020-05-28 HISTORY — DX: Personal history of other malignant neoplasm of skin: Z98.890

## 2020-05-28 HISTORY — DX: Basal cell carcinoma of skin, unspecified: C44.91

## 2020-05-28 HISTORY — DX: Actinic keratosis: L57.0

## 2020-05-28 MED ORDER — PIMECROLIMUS 1 % EX CREA: TOPICAL_CREAM | CUTANEOUS | 2 refills | Status: DC

## 2020-05-28 NOTE — Patient Instructions (Addendum)
Glucosamine chondroitin for joint pain.  Melanoma ABCDEs  Melanoma is the most dangerous type of skin cancer, and is the leading cause of death from skin disease.  You are more likely to develop melanoma if you:  Have light-colored skin, light-colored eyes, or red or blond hair  Spend a lot of time in the sun  Tan regularly, either outdoors or in a tanning bed  Have had blistering sunburns, especially during childhood  Have a close family member who has had a melanoma  Have atypical moles or large birthmarks  Early detection of melanoma is key since treatment is typically straightforward and cure rates are extremely high if we catch it early.   The first sign of melanoma is often a change in a mole or a new dark spot.  The ABCDE system is a way of remembering the signs of melanoma.  A for asymmetry:  The two halves do not match. B for border:  The edges of the growth are irregular. C for color:  A mixture of colors are present instead of an even brown color. D for diameter:  Melanomas are usually (but not always) greater than 11mm - the size of a pencil eraser. E for evolution:  The spot keeps changing in size, shape, and color.  Please check your skin once per month between visits. You can use a small mirror in front and a large mirror behind you to keep an eye on the back side or your body.   If you see any new or changing lesions before your next follow-up, please call to schedule a visit.  Please continue daily skin protection including broad spectrum sunscreen SPF 30+ to sun-exposed areas, reapplying every 2 hours as needed when you're outdoors.   Wound Care Instructions  1. Cleanse wound gently with soap and water once a day then pat dry with clean gauze. Apply a thing coat of Petrolatum (petroleum jelly, "Vaseline") over the wound (unless you have an allergy to this). We recommend that you use a new, sterile tube of Vaseline. Do not pick or remove scabs. Do not remove the  yellow or white "healing tissue" from the base of the wound.  2. Cover the wound with fresh, clean, nonstick gauze and secure with paper tape. You may use Band-Aids in place of gauze and tape if the would is small enough, but would recommend trimming much of the tape off as there is often too much. Sometimes Band-Aids can irritate the skin.  3. You should call the office for your biopsy report after 1 week if you have not already been contacted.  4. If you experience any problems, such as abnormal amounts of bleeding, swelling, significant bruising, significant pain, or evidence of infection, please call the office immediately.  5. FOR ADULT SURGERY PATIENTS: If you need something for pain relief you may take 1 extra strength Tylenol (acetaminophen) AND 2 Ibuprofen (200mg  each) together every 4 hours as needed for pain. (do not take these if you are allergic to them or if you have a reason you should not take them.) Typically, you may only need pain medication for 1 to 3 days.    Cryotherapy Aftercare  . Wash gently with soap and water everyday.   Marland Kitchen Apply Vaseline and Band-Aid daily until healed.

## 2020-05-28 NOTE — Progress Notes (Signed)
Follow-Up Visit   Subjective  Christian Santos is a 68 y.o. male who presents for the following: Psoriasis (Patient has started taking Equilac (concentrated mare's milk) and psoriasis has cleared up. He has joint aches but it is most consistent with osteoarthritis.   The only other thing he is using is Elidel as needed.  Patient does have osteoarthritis in hands, neck and balls of feet.  The patient presents for Upper Body Skin Exam (UBSE) for skin cancer screening and mole check.  The following portions of the chart were reviewed this encounter and updated as appropriate:  Tobacco  Allergies  Meds  Problems  Med Hx  Surg Hx  Fam Hx     Review of Systems:  No other skin or systemic complaints except as noted in HPI or Assessment and Plan.  Objective  Well appearing patient in no apparent distress; mood and affect are within normal limits.  All skin waist up examined.  Objective  Right Elbow - Posterior: clear  Objective  Right Temple at hairline: 0.7cm pearly papule  Objective  Left scapula: Firm pink/brown papulenodule with dimple sign.   Objective  Left Temple: Erythematous thin papules/macules with gritty scale.    Assessment & Plan  Psoriasis Right Elbow - Posterior May continue Elidel as needed and over the counter Equilac.  He should discuss treatment for osteoarthritis with his PCP  and recommend patient look into glucosamine chondroitin for joint pain. Ordered Medications: pimecrolimus (ELIDEL) 1 % cream  Neoplasm of uncertain behavior of skin Right Temple at hairline  Epidermal / dermal shaving  Lesion diameter (cm):  0.7 Informed consent: discussed and consent obtained   Timeout: patient name, date of birth, surgical site, and procedure verified   Procedure prep:  Patient was prepped and draped in usual sterile fashion Prep type:  Isopropyl alcohol Anesthesia: the lesion was anesthetized in a standard fashion   Anesthetic:  1% lidocaine  w/ epinephrine 1-100,000 buffered w/ 8.4% NaHCO3 Instrument used: flexible razor blade   Hemostasis achieved with: pressure, aluminum chloride and electrodesiccation   Outcome: patient tolerated procedure well   Post-procedure details: sterile dressing applied and wound care instructions given   Dressing type: bandage and petrolatum    Destruction of lesion Complexity: extensive   Destruction method: electrodesiccation and curettage   Informed consent: discussed and consent obtained   Timeout:  patient name, date of birth, surgical site, and procedure verified Procedure prep:  Patient was prepped and draped in usual sterile fashion Prep type:  Isopropyl alcohol Anesthesia: the lesion was anesthetized in a standard fashion   Anesthetic:  1% lidocaine w/ epinephrine 1-100,000 buffered w/ 8.4% NaHCO3 Curettage performed in three different directions: Yes   Electrodesiccation performed over the curetted area: Yes   Lesion length (cm):  0.7 Lesion width (cm):  0.7 Margin per side (cm):  0.2 Final wound size (cm):  1.1 Hemostasis achieved with:  pressure, aluminum chloride and electrodesiccation Outcome: patient tolerated procedure well with no complications   Post-procedure details: sterile dressing applied and wound care instructions given   Dressing type: bandage and petrolatum    Specimen 1 - Surgical pathology Differential Diagnosis: ISK vs BCC Check Margins: No 0.7cm pearly papule  Dermatofibroma Left scapula Benign, observe.   AK (actinic keratosis) Left Temple  Destruction of lesion - Left Temple Complexity: simple   Destruction method: cryotherapy   Informed consent: discussed and consent obtained   Timeout:  patient name, date of birth, surgical site, and procedure verified  Lesion destroyed using liquid nitrogen: Yes   Region frozen until ice ball extended beyond lesion: Yes   Outcome: patient tolerated procedure well with no complications   Post-procedure details:  wound care instructions given    Lentigines - Scattered tan macules - Discussed due to sun exposure - Benign, observe - Call for any changes  Seborrheic Keratoses - Stuck-on, waxy, tan-brown papules and plaques  - Discussed benign etiology and prognosis. - Observe - Call for any changes  Melanocytic Nevi - Tan-brown and/or pink-flesh-colored symmetric macules and papules - Benign appearing on exam today - Observation - Call clinic for new or changing moles - Recommend daily use of broad spectrum spf 30+ sunscreen to sun-exposed areas.   Hemangiomas - Red papules - Discussed benign nature - Observe - Call for any changes  Actinic Damage - diffuse scaly erythematous macules with underlying dyspigmentation - Recommend daily broad spectrum sunscreen SPF 30+ to sun-exposed areas, reapply every 2 hours as needed.  - Call for new or changing lesions.  Skin cancer screening performed today.  Return in about 6 months (around 11/28/2020) for AK follow up.  Graciella Belton, RMA, am acting as scribe for Sarina Ser, MD . Documentation: I have reviewed the above documentation for accuracy and completeness, and I agree with the above.  Sarina Ser, MD

## 2020-06-04 ENCOUNTER — Telehealth: Payer: Self-pay

## 2020-06-04 NOTE — Telephone Encounter (Signed)
Patient informed of pathology results 

## 2020-06-04 NOTE — Telephone Encounter (Signed)
-----   Message from Ralene Bathe, MD sent at 05/31/2020 12:58 PM EDT ----- Skin , right temple at hairline BASAL CELL CARCINOMA, NODULAR PATTERN  Cancer - BCC Already treated Recheck next visit

## 2020-06-05 ENCOUNTER — Encounter: Payer: Self-pay | Admitting: Dermatology

## 2020-06-06 DIAGNOSIS — M542 Cervicalgia: Secondary | ICD-10-CM | POA: Diagnosis not present

## 2020-06-06 DIAGNOSIS — M4712 Other spondylosis with myelopathy, cervical region: Secondary | ICD-10-CM | POA: Diagnosis not present

## 2020-06-08 DIAGNOSIS — M542 Cervicalgia: Secondary | ICD-10-CM | POA: Diagnosis not present

## 2020-06-08 DIAGNOSIS — M4712 Other spondylosis with myelopathy, cervical region: Secondary | ICD-10-CM | POA: Diagnosis not present

## 2020-06-12 DIAGNOSIS — M4712 Other spondylosis with myelopathy, cervical region: Secondary | ICD-10-CM | POA: Diagnosis not present

## 2020-06-12 DIAGNOSIS — M542 Cervicalgia: Secondary | ICD-10-CM | POA: Diagnosis not present

## 2020-06-14 DIAGNOSIS — M4712 Other spondylosis with myelopathy, cervical region: Secondary | ICD-10-CM | POA: Diagnosis not present

## 2020-06-14 DIAGNOSIS — M542 Cervicalgia: Secondary | ICD-10-CM | POA: Diagnosis not present

## 2020-06-18 DIAGNOSIS — M5412 Radiculopathy, cervical region: Secondary | ICD-10-CM | POA: Diagnosis not present

## 2020-06-18 DIAGNOSIS — M542 Cervicalgia: Secondary | ICD-10-CM | POA: Diagnosis not present

## 2020-06-19 DIAGNOSIS — M4712 Other spondylosis with myelopathy, cervical region: Secondary | ICD-10-CM | POA: Diagnosis not present

## 2020-06-19 DIAGNOSIS — M542 Cervicalgia: Secondary | ICD-10-CM | POA: Diagnosis not present

## 2020-06-21 DIAGNOSIS — M4712 Other spondylosis with myelopathy, cervical region: Secondary | ICD-10-CM | POA: Diagnosis not present

## 2020-06-21 DIAGNOSIS — M542 Cervicalgia: Secondary | ICD-10-CM | POA: Diagnosis not present

## 2020-06-26 DIAGNOSIS — M4712 Other spondylosis with myelopathy, cervical region: Secondary | ICD-10-CM | POA: Diagnosis not present

## 2020-06-26 DIAGNOSIS — M542 Cervicalgia: Secondary | ICD-10-CM | POA: Diagnosis not present

## 2020-06-27 DIAGNOSIS — M50223 Other cervical disc displacement at C6-C7 level: Secondary | ICD-10-CM | POA: Diagnosis not present

## 2020-06-27 DIAGNOSIS — M542 Cervicalgia: Secondary | ICD-10-CM | POA: Diagnosis not present

## 2020-06-27 DIAGNOSIS — M47812 Spondylosis without myelopathy or radiculopathy, cervical region: Secondary | ICD-10-CM | POA: Diagnosis not present

## 2020-06-28 DIAGNOSIS — M4712 Other spondylosis with myelopathy, cervical region: Secondary | ICD-10-CM | POA: Diagnosis not present

## 2020-06-28 DIAGNOSIS — M542 Cervicalgia: Secondary | ICD-10-CM | POA: Diagnosis not present

## 2020-07-08 ENCOUNTER — Encounter: Payer: Self-pay | Admitting: Internal Medicine

## 2020-07-09 ENCOUNTER — Other Ambulatory Visit: Payer: Self-pay | Admitting: Internal Medicine

## 2020-07-09 DIAGNOSIS — M542 Cervicalgia: Secondary | ICD-10-CM

## 2020-07-09 DIAGNOSIS — I10 Essential (primary) hypertension: Secondary | ICD-10-CM | POA: Diagnosis not present

## 2020-07-09 NOTE — Progress Notes (Signed)
Patient sent this message: [facility] diagnosed me with inflamed arthritis in my neck in July.  I took prednisone and attended ten physical therapy sessions.  Then I was referred for an MRI.  I was to be seen by Dr. Kayleen Memos on 10/1  to review the results of the MRI for a possible pain injection but they changed my appointment to 10/15.  I am still in mild continuous pain.  May I be referred to [facility] to either the pain management team or the orthopedic team?  Thank you.  Felt best to refer to orthopedics, and noted he resides in [facility] and not refer to [facility] as he has been seeing.

## 2020-07-12 ENCOUNTER — Ambulatory Visit: Payer: Medicare Other

## 2020-07-20 DIAGNOSIS — M4802 Spinal stenosis, cervical region: Secondary | ICD-10-CM | POA: Diagnosis not present

## 2020-07-31 DIAGNOSIS — M5030 Other cervical disc degeneration, unspecified cervical region: Secondary | ICD-10-CM | POA: Diagnosis not present

## 2020-07-31 DIAGNOSIS — M5412 Radiculopathy, cervical region: Secondary | ICD-10-CM | POA: Diagnosis not present

## 2020-08-07 DIAGNOSIS — M5030 Other cervical disc degeneration, unspecified cervical region: Secondary | ICD-10-CM | POA: Diagnosis not present

## 2020-08-07 DIAGNOSIS — M5412 Radiculopathy, cervical region: Secondary | ICD-10-CM | POA: Diagnosis not present

## 2020-08-17 DIAGNOSIS — M436 Torticollis: Secondary | ICD-10-CM | POA: Diagnosis not present

## 2020-08-17 DIAGNOSIS — G540 Brachial plexus disorders: Secondary | ICD-10-CM | POA: Diagnosis not present

## 2020-08-17 DIAGNOSIS — M9901 Segmental and somatic dysfunction of cervical region: Secondary | ICD-10-CM | POA: Diagnosis not present

## 2020-08-17 DIAGNOSIS — M542 Cervicalgia: Secondary | ICD-10-CM | POA: Diagnosis not present

## 2020-08-20 DIAGNOSIS — G540 Brachial plexus disorders: Secondary | ICD-10-CM | POA: Diagnosis not present

## 2020-08-20 DIAGNOSIS — M9901 Segmental and somatic dysfunction of cervical region: Secondary | ICD-10-CM | POA: Diagnosis not present

## 2020-08-20 DIAGNOSIS — M542 Cervicalgia: Secondary | ICD-10-CM | POA: Diagnosis not present

## 2020-08-20 DIAGNOSIS — M436 Torticollis: Secondary | ICD-10-CM | POA: Diagnosis not present

## 2020-08-22 DIAGNOSIS — M436 Torticollis: Secondary | ICD-10-CM | POA: Diagnosis not present

## 2020-08-22 DIAGNOSIS — G540 Brachial plexus disorders: Secondary | ICD-10-CM | POA: Diagnosis not present

## 2020-08-22 DIAGNOSIS — M542 Cervicalgia: Secondary | ICD-10-CM | POA: Diagnosis not present

## 2020-08-22 DIAGNOSIS — M9901 Segmental and somatic dysfunction of cervical region: Secondary | ICD-10-CM | POA: Diagnosis not present

## 2020-08-24 DIAGNOSIS — M436 Torticollis: Secondary | ICD-10-CM | POA: Diagnosis not present

## 2020-08-24 DIAGNOSIS — M9901 Segmental and somatic dysfunction of cervical region: Secondary | ICD-10-CM | POA: Diagnosis not present

## 2020-08-24 DIAGNOSIS — G540 Brachial plexus disorders: Secondary | ICD-10-CM | POA: Diagnosis not present

## 2020-08-24 DIAGNOSIS — M542 Cervicalgia: Secondary | ICD-10-CM | POA: Diagnosis not present

## 2020-08-29 DIAGNOSIS — G540 Brachial plexus disorders: Secondary | ICD-10-CM | POA: Diagnosis not present

## 2020-08-29 DIAGNOSIS — M436 Torticollis: Secondary | ICD-10-CM | POA: Diagnosis not present

## 2020-08-29 DIAGNOSIS — M9901 Segmental and somatic dysfunction of cervical region: Secondary | ICD-10-CM | POA: Diagnosis not present

## 2020-08-29 DIAGNOSIS — M542 Cervicalgia: Secondary | ICD-10-CM | POA: Diagnosis not present

## 2020-09-03 DIAGNOSIS — M542 Cervicalgia: Secondary | ICD-10-CM | POA: Diagnosis not present

## 2020-09-03 DIAGNOSIS — M9901 Segmental and somatic dysfunction of cervical region: Secondary | ICD-10-CM | POA: Diagnosis not present

## 2020-09-03 DIAGNOSIS — G540 Brachial plexus disorders: Secondary | ICD-10-CM | POA: Diagnosis not present

## 2020-09-03 DIAGNOSIS — M436 Torticollis: Secondary | ICD-10-CM | POA: Diagnosis not present

## 2020-09-04 DIAGNOSIS — M5030 Other cervical disc degeneration, unspecified cervical region: Secondary | ICD-10-CM | POA: Diagnosis not present

## 2020-09-04 DIAGNOSIS — M5412 Radiculopathy, cervical region: Secondary | ICD-10-CM | POA: Diagnosis not present

## 2020-09-20 ENCOUNTER — Ambulatory Visit (INDEPENDENT_AMBULATORY_CARE_PROVIDER_SITE_OTHER): Payer: Medicare Other

## 2020-09-20 DIAGNOSIS — Z0000 Encounter for general adult medical examination without abnormal findings: Secondary | ICD-10-CM

## 2020-09-20 NOTE — Progress Notes (Signed)
Subjective:   Christian Santos is a 69 y.o. male who presents for Medicare Annual/Subsequent preventive examination.  Virtual Visit via Telephone Note  I connected with  Christian Santos on 09/20/20 at  8:00 AM EST by telephone and verified that I am speaking with the correct person using two identifiers.  Location: Patient: home Provider: [place] Persons participating in the virtual visit: [place]   I discussed the limitations, risks, security and privacy concerns of performing an evaluation and management service by telephone and the availability of in person appointments. The patient expressed understanding and agreed to proceed.  Interactive audio and video telecommunications were attempted between this nurse and patient, however failed, due to patient having technical difficulties OR patient did not have access to video capability.  We continued and completed visit with audio only.  Some vital signs may be absent or patient reported.   Clemetine Marker, LPN    Review of Systems     Cardiac Risk Factors include: advanced age (>81men, >49 women);male gender;dyslipidemia;hypertension     Objective:    There were no vitals filed for this visit. There is no height or weight on file to calculate BMI.  Advanced Directives 09/20/2020 03/08/2020 06/04/2017 06/02/2017 05/05/2017 02/25/2017 02/03/2017  Does Patient Have a Medical Advance Directive? Yes Yes No No No No No  Type of Paramedic of [place];Living will [place];Living will - - - - -  Does patient want to make changes to medical advance directive? - - - - - - -  Copy of [place] in Chart? No - copy requested No - copy requested - - - - -    Current Medications (verified) Outpatient Encounter Medications as of 09/20/2020  Medication Sig  . acetaminophen (TYLENOL) 650 MG CR tablet Take 2 tablets by mouth 2 (two) times daily.  Marland Kitchen amLODipine  (NORVASC) 5 MG tablet Take 1 tablet (5 mg total) by mouth daily.  . fluticasone (FLONASE) 50 MCG/ACT nasal spray Place 2 sprays into both nostrils daily.  . Magnesium 500 MG CAPS Take by mouth.  . melatonin 5 MG TABS Take 5 mg by mouth.  . metoprolol succinate (TOPROL-XL) 50 MG 24 hr tablet Take 1 tablet by mouth daily.  . Multiple Vitamins-Minerals (MULTIVITAMIN WITH MINERALS) tablet Take 1 tablet by mouth daily.  Marland Kitchen omeprazole (PRILOSEC) 40 MG capsule TAKE 1 CAPSULE BY MOUTH ONCE DAILY  . pimecrolimus (ELIDEL) 1 % cream Apply to affected areas psoriasis 1-2 times daily as needed.  . Probiotic Product (PROBIOTIC ADVANCED PO) Take by mouth.  Marland Kitchen UNABLE TO FIND Take by mouth. equilac 1200 mg for psoriasis  . [DISCONTINUED] Acetaminophen (TYLENOL ARTHRITIS PAIN PO) Take by mouth. Two in the morning and two at night  . [DISCONTINUED] aspirin EC 81 MG tablet Take by mouth.  . [DISCONTINUED] atenolol (TENORMIN) 50 MG tablet Take 50 mg by mouth daily.  . [DISCONTINUED] methocarbamol (ROBAXIN) 750 MG tablet Take 500 mg by mouth 3 (three) times daily.  . [DISCONTINUED] traMADol (ULTRAM) 50 MG tablet Take by mouth every 6 (six) hours as needed.   No facility-administered encounter medications on file as of 09/20/2020.    Allergies (verified) Alcohol, Erythromycin base, Lisinopril, Other, Wool alcohol  [lanolin], and Clobetasol   History: Past Medical History:  Diagnosis Date  . Allergy 2017  . Arthritis 2017  . Dysplastic nevus 02/20/2014   Left posterior shoulder. Moderate atypia, limited margins free.  Marland Kitchen GERD (gastroesophageal  reflux disease)   . Hyperlipidemia   . Hypertension   . Insomnia   . Polycythemia vera ([place])   . Psoriasis   . Sleep apnea 2020   Past Surgical History:  Procedure Laterality Date  . HAND SURGERY     Family History  Problem Relation Age of Onset  . Cancer Mother   . Hearing loss Mother   . Heart attack Father   . Cancer Father   . Heart disease Father   .  Hypertension Father   . Heart attack Brother   . Heart disease Brother   . ADD / ADHD Daughter   . Asthma Daughter   . Anxiety disorder Daughter   . Asthma Daughter   . Arthritis Maternal Grandmother    Social History   Socioeconomic History  . Marital status: Married    Spouse name: Not on file  . Number of children: 3  . Years of education: Not on file  . Highest education level: Not on file  Occupational History  . Not on file  Tobacco Use  . Smoking status: Never Smoker  . Smokeless tobacco: Never Used  Vaping Use  . Vaping Use: Never used  Substance and Sexual Activity  . Alcohol use: Yes    Alcohol/week: 0.0 standard drinks  . Drug use: No  . Sexual activity: Yes    Partners: Female    Birth control/protection: None  Other Topics Concern  . Not on file  Social History Narrative  . Not on file   Social Determinants of Health   Financial Resource Strain: Low Risk   . Difficulty of Paying Living Expenses: Not hard at all  Food Insecurity: No Food Insecurity  . Worried About Charity fundraiser in the Last Year: Never true  . Ran Out of Food in the Last Year: Never true  Transportation Needs: No Transportation Needs  . Lack of Transportation (Medical): No  . Lack of Transportation (Non-Medical): No  Physical Activity: Sufficiently Active  . Days of Exercise per Week: 7 days  . Minutes of Exercise per Session: 60 min  Stress: No Stress Concern Present  . Feeling of Stress : Not at all  Social Connections: Moderately Integrated  . Frequency of Communication with Friends and Family: More than three times a week  . Frequency of Social Gatherings with Friends and Family: Three times a week  . Attends Religious Services: More than 4 times per year  . Active Member of Clubs or Organizations: No  . Attends Archivist Meetings: Never  . Marital Status: Married    Tobacco Counseling Counseling given: Not Answered   Clinical Intake:  Pre-visit  preparation completed: Yes  Pain : No/denies pain     Nutritional Risks: None Diabetes: No  How often do you need to have someone help you when you read instructions, pamphlets, or other written materials from your doctor or pharmacy?: 1 - Never    Interpreter Needed?: No  Information entered by :: Clemetine Marker LPN   Activities of Daily Living In your present state of health, do you have any difficulty performing the following activities: 09/20/2020 05/25/2020  Hearing? Tempie Donning  Comment wears hearing aids -  Vision? N Y  Difficulty concentrating or making decisions? N N  Walking or climbing stairs? N N  Dressing or bathing? N N  Doing errands, shopping? N N  Preparing Food and eating ? N -  Using the Toilet? N -  In the past  six months, have you accidently leaked urine? N -  Do you have problems with loss of bowel control? N -  Managing your Medications? N -  Managing your Finances? N -  Housekeeping or managing your Housekeeping? N -  Some recent data might be hidden    Patient Care Team: Towanda Malkin, MD as PCP - General (Internal Medicine)  Indicate any recent Medical Services you may have received from other than [facility] providers in the past year (date may be approximate).     Assessment:   This is a routine wellness examination for [employer].  Hearing/Vision screen  Hearing Screening   125Hz  250Hz  500Hz  1000Hz  2000Hz  3000Hz  4000Hz  6000Hz  8000Hz   Right ear:           Left ear:           Comments: Patient wears hearing aids maintained by [facility]  Vision Screening Comments: Annual vision screenings done by Dr. Carmelia Roller in [facility]   Dietary issues and exercise activities discussed: Current Exercise Habits: The patient has a physically strenuous job, but has no regular exercise apart from work., Exercise limited by: None identified  Goals    . Patient Stated     Patient would like to work less and hopefully retire      Depression  Screen PHQ 2/9 Scores 09/20/2020 03/08/2020 01/24/2020 10/15/2017 06/02/2017 05/05/2017 02/25/2017  PHQ - 2 Score 0 0 0 0 0 0 0  PHQ- 9 Score - - 4 - - - -    Fall Risk Fall Risk  09/20/2020 05/25/2020 03/08/2020 01/24/2020 10/15/2017  Falls in the past year? 0 0 0 0 No  Number falls in past yr: 0 0 0 0 -  Injury with Fall? 0 0 0 0 -  Risk for fall due to : History of fall(s) - No Fall Risks - -  Follow up Falls prevention discussed - Falls prevention discussed - -    FALL RISK PREVENTION PERTAINING TO THE HOME:  Any stairs in or around the home? Yes  If so, are there any without handrails? No  Home free of loose throw rugs in walkways, pet beds, electrical cords, etc? Yes  Adequate lighting in your home to reduce risk of falls? Yes   ASSISTIVE DEVICES UTILIZED TO PREVENT FALLS:  Life alert? No  Use of a cane, walker or w/c? No  Grab bars in the bathroom? No  Shower chair or bench in shower? No  Elevated toilet seat or a handicapped toilet? Yes   TIMED UP AND GO:  Was the test performed? No . Telephonic visit.   Cognitive Function: Pt declined 6CIT for 2021 AWV.      6CIT Screen 03/08/2020  What Year? 0 points  What month? 3 points  What time? 0 points  Count back from 20 0 points  Months in reverse 4 points  Repeat phrase 0 points  Total Score 7    Immunizations Immunization History  Administered Date(s) Administered  . Influenza, High Dose Seasonal PF 07/16/2017  . Influenza,inj,Quad PF,6+ Mos 08/28/2015  . Influenza-Unspecified 07/08/2016, 09/11/2018  . PFIZER SARS-COV-2 Vaccination 12/01/2019, 12/22/2019  . Pneumococcal Polysaccharide-23 01/11/2014  . Td 06/04/2017  . Zoster 01/05/2011  . Zoster Recombinat (Shingrix) 06/05/2018, 10/18/2018    TDAP status: Up to date  Flu Vaccine status: Due, Education has been provided regarding the importance of this vaccine. Advised may receive this vaccine at local pharmacy or Health Dept. Aware to provide a copy of the  vaccination record if obtained from local pharmacy or Health Dept. Verbalized acceptance and understanding.  Pneumococcal vaccine status: Declined,  Education has been provided regarding the importance of this vaccine but patient still declined. Advised may receive this vaccine at local pharmacy or Health Dept. Aware to provide a copy of the vaccination record if obtained from local pharmacy or Health Dept. Verbalized acceptance and understanding.   Covid-19 vaccine status: Completed vaccines  Qualifies for Shingles Vaccine? Yes   Zostavax completed Yes   Shingrix Completed?: Yes  Screening Tests Health Maintenance  Topic Date Due  . PNA vac Low Risk Adult (1 of 2 - PCV13) 06/06/2016  . COVID-19 Vaccine (3 - Pfizer risk 4-dose series) 01/19/2020  . INFLUENZA VACCINE  05/06/2020  . COLONOSCOPY  11/25/2021  . TETANUS/TDAP  06/05/2027  . Hepatitis C Screening  Completed    Health Maintenance  Health Maintenance Due  Topic Date Due  . PNA vac Low Risk Adult (1 of 2 - PCV13) 06/06/2016  . COVID-19 Vaccine (3 - Pfizer risk 4-dose series) 01/19/2020  . INFLUENZA VACCINE  05/06/2020    Colorectal cancer screening: Type of screening: Colonoscopy. Completed 11/25/16. Repeat every 5 years  Lung Cancer Screening: (Low Dose CT Chest recommended if Age 15-80 years, 30 pack-year currently smoking OR have quit w/in 15years.) does not qualify.   Additional Screening:  Hepatitis C Screening: does qualify; Completed 05/05/17  Vision Screening: Recommended annual ophthalmology exams for early detection of glaucoma and other disorders of the eye. Is the patient up to date with their annual eye exam?  Yes  Who is the provider or what is the name of the office in which the patient attends annual eye exams? Dr. Luan Pulling  Dental Screening: Recommended annual dental exams for proper oral hygiene  Community Resource Referral / Chronic Care Management: CRR required this visit?  No   CCM required this  visit?  No      Plan:     I have personally reviewed and noted the following in the patient's chart:   . Medical and social history . Use of alcohol, tobacco or illicit drugs  . Current medications and supplements . Functional ability and status . Nutritional status . Physical activity . Advanced directives . List of other physicians . Hospitalizations, surgeries, and ER visits in previous 12 months . Vitals . Screenings to include cognitive, depression, and falls . Referrals and appointments  In addition, I have reviewed and discussed with patient certain preventive protocols, quality metrics, and best practice recommendations. A written personalized care plan for preventive services as well as general preventive health recommendations were provided to patient.     Clemetine Marker, LPN   [id number]   Nurse Notes: patient requests to see Dr. Roxan Hockey prior to next scheduled appt in Feb in order be able to see him before he leaves the practice. Pt advised to contact scheduling for availability.

## 2020-09-20 NOTE — Patient Instructions (Signed)
Christian Santos , Thank you for taking time to come for your Medicare Wellness Visit. I appreciate your ongoing commitment to your health goals. Please review the following plan we discussed and let me know if I can assist you in the future.   Screening recommendations/referrals: Colonoscopy: done 11/25/16. Repeat in 2023 Recommended yearly ophthalmology/optometry visit for glaucoma screening and checkup Recommended yearly dental visit for hygiene and checkup  Vaccinations: Influenza vaccine: due Pneumococcal vaccine: done 01/11/14. Due for Prevnar13 Tdap vaccine: done 06/04/17 Shingles vaccine: done 06/05/18 & 10/18/18   Covid-19: done 12/01/19 & 12/22/19  Advanced directives: Please bring a copy of your health care power of attorney and living will to the office at your convenience.  Conditions/risks identified: Keep up the great work!  Next appointment: Follow up in one year for your annual wellness visit.   Preventive Care 51 Years and Older, Male Preventive care refers to lifestyle choices and visits with your health care provider that can promote health and wellness. What does preventive care include?  A yearly physical exam. This is also called an annual well check.  Dental exams once or twice a year.  Routine eye exams. Ask your health care provider how often you should have your eyes checked.  Personal lifestyle choices, including:  Daily care of your teeth and gums.  Regular physical activity.  Eating a healthy diet.  Avoiding tobacco and drug use.  Limiting alcohol use.  Practicing safe sex.  Taking low doses of aspirin every day.  Taking vitamin and mineral supplements as recommended by your health care provider. What happens during an annual well check? The services and screenings done by your health care provider during your annual well check will depend on your age, overall health, lifestyle risk factors, and family history of disease. Counseling  Your health  care provider may ask you questions about your:  Alcohol use.  Tobacco use.  Drug use.  Emotional well-being.  Home and relationship well-being.  Sexual activity.  Eating habits.  History of falls.  Memory and ability to understand (cognition).  Work and work Statistician. Screening  You may have the following tests or measurements:  Height, weight, and BMI.  Blood pressure.  Lipid and cholesterol levels. These may be checked every 5 years, or more frequently if you are over 61 years old.  Skin check.  Lung cancer screening. You may have this screening every year starting at age 57 if you have a 30-pack-year history of smoking and currently smoke or have quit within the past 15 years.  Fecal occult blood test (FOBT) of the stool. You may have this test every year starting at age 19.  Flexible sigmoidoscopy or colonoscopy. You may have a sigmoidoscopy every 5 years or a colonoscopy every 10 years starting at age 37.  Prostate cancer screening. Recommendations will vary depending on your family history and other risks.  Hepatitis C blood test.  Hepatitis B blood test.  Sexually transmitted disease (STD) testing.  Diabetes screening. This is done by checking your blood sugar (glucose) after you have not eaten for a while (fasting). You may have this done every 1-3 years.  Abdominal aortic aneurysm (AAA) screening. You may need this if you are a current or former smoker.  Osteoporosis. You may be screened starting at age 63 if you are at high risk. Talk with your health care provider about your test results, treatment options, and if necessary, the need for more tests. Vaccines  Your health care provider  may recommend certain vaccines, such as:  Influenza vaccine. This is recommended every year.  Tetanus, diphtheria, and acellular pertussis (Tdap, Td) vaccine. You may need a Td booster every 10 years.  Zoster vaccine. You may need this after age  83.  Pneumococcal 13-valent conjugate (PCV13) vaccine. One dose is recommended after age 14.  Pneumococcal polysaccharide (PPSV23) vaccine. One dose is recommended after age 78. Talk to your health care provider about which screenings and vaccines you need and how often you need them. This information is not intended to replace advice given to you by your health care provider. Make sure you discuss any questions you have with your health care provider. Document Released: 10/19/2015 Document Revised: 06/11/2016 Document Reviewed: 07/24/2015 Elsevier Interactive Patient Education  2017 [place] Prevention in the Home Falls can cause injuries. They can happen to people of all ages. There are many things you can do to make your home safe and to help prevent falls. What can I do on the outside of my home?  Regularly fix the edges of walkways and driveways and fix any cracks.  Remove anything that might make you trip as you walk through a door, such as a raised step or threshold.  Trim any bushes or trees on the path to your home.  Use bright outdoor lighting.  Clear any walking paths of anything that might make someone trip, such as rocks or tools.  Regularly check to see if handrails are loose or broken. Make sure that both sides of any steps have handrails.  Any raised decks and porches should have guardrails on the edges.  Have any leaves, snow, or ice cleared regularly.  Use sand or salt on walking paths during winter.  Clean up any spills in your garage right away. This includes oil or grease spills. What can I do in the bathroom?  Use night lights.  Install grab bars by the toilet and in the tub and shower. Do not use towel bars as grab bars.  Use non-skid mats or decals in the tub or shower.  If you need to sit down in the shower, use a plastic, non-slip stool.  Keep the floor dry. Clean up any water that spills on the floor as soon as it happens.  Remove  soap buildup in the tub or shower regularly.  Attach bath mats securely with double-sided non-slip rug tape.  Do not have throw rugs and other things on the floor that can make you trip. What can I do in the bedroom?  Use night lights.  Make sure that you have a light by your bed that is easy to reach.  Do not use any sheets or blankets that are too big for your bed. They should not hang down onto the floor.  Have a firm chair that has side arms. You can use this for support while you get dressed.  Do not have throw rugs and other things on the floor that can make you trip. What can I do in the kitchen?  Clean up any spills right away.  Avoid walking on wet floors.  Keep items that you use a lot in easy-to-reach places.  If you need to reach something above you, use a strong step stool that has a grab bar.  Keep electrical cords out of the way.  Do not use floor polish or wax that makes floors slippery. If you must use wax, use non-skid floor wax.  Do not have throw rugs  and other things on the floor that can make you trip. What can I do with my stairs?  Do not leave any items on the stairs.  Make sure that there are handrails on both sides of the stairs and use them. Fix handrails that are broken or loose. Make sure that handrails are as long as the stairways.  Check any carpeting to make sure that it is firmly attached to the stairs. Fix any carpet that is loose or worn.  Avoid having throw rugs at the top or bottom of the stairs. If you do have throw rugs, attach them to the floor with carpet tape.  Make sure that you have a light switch at the top of the stairs and the bottom of the stairs. If you do not have them, ask someone to add them for you. What else can I do to help prevent falls?  Wear shoes that:  Do not have high heels.  Have rubber bottoms.  Are comfortable and fit you well.  Are closed at the toe. Do not wear sandals.  If you use a  stepladder:  Make sure that it is fully opened. Do not climb a closed stepladder.  Make sure that both sides of the stepladder are locked into place.  Ask someone to hold it for you, if possible.  Clearly mark and make sure that you can see:  Any grab bars or handrails.  First and last steps.  Where the edge of each step is.  Use tools that help you move around (mobility aids) if they are needed. These include:  Canes.  Walkers.  Scooters.  Crutches.  Turn on the lights when you go into a dark area. Replace any light bulbs as soon as they burn out.  Set up your furniture so you have a clear path. Avoid moving your furniture around.  If any of your floors are uneven, fix them.  If there are any pets around you, be aware of where they are.  Review your medicines with your doctor. Some medicines can make you feel dizzy. This can increase your chance of falling. Ask your doctor what other things that you can do to help prevent falls. This information is not intended to replace advice given to you by your health care provider. Make sure you discuss any questions you have with your health care provider. Document Released: 07/19/2009 Document Revised: 02/28/2016 Document Reviewed: 10/27/2014 Elsevier Interactive Patient Education  2017 [employer].

## 2020-09-24 DIAGNOSIS — M5412 Radiculopathy, cervical region: Secondary | ICD-10-CM | POA: Diagnosis not present

## 2020-09-24 DIAGNOSIS — M5030 Other cervical disc degeneration, unspecified cervical region: Secondary | ICD-10-CM | POA: Diagnosis not present

## 2020-10-04 NOTE — Progress Notes (Signed)
Patient ID: Christian Santos, male    DOB: [birth year], 69 y.o.   MRN: [id number]  PCP: Towanda Malkin, MD  Chief Complaint  Patient presents with  . Medication Refill    Subjective:   Christian Santos is a 69 y.o. male, presents to clinic with CC of the following:  Chief Complaint  Patient presents with  . Medication Refill    HPI:  Patient is a 69 y.o.male Last visit was in August after re-establishing with the practice in April The BMP completed at that visit was all normal. Follows up today All in all, has been feeling well.   Neck pain/cervical disc disease: In early October, communication with the patient was as follows     Patient sent this message: [facility] diagnosed me with inflamed arthritis in my neck in July. I took prednisone and attended ten physical therapy sessions. Then I was referred for an MRI. I was to be seen by Dr. Kayleen Memos on 10/1 to review the results of the MRI for a possible pain injection but they changed my appointment to 10/15. I am still in mild continuous pain. May I be referred to [facility] to either the pain management team or the orthopedic team? Thank you.  Felt best to refer to orthopedics, and noted he resides in [facility] and not refer to [facility] as he has been seeing.      He saw PM & R 07/31/21 with the following Impression/Plan noted:  1Impression 1. Acute right parascapular pain Initially his pain was very severe in the right lateral neck radiating to the right parascapular musculature and has improved mildly and is now rated as a moderate pain. Clinically his symptoms are most consistent with a cervical radiculitis particularly C4 versus 5.  MRI of the cervical spine without contrast from [facility] imaging unavailable report reviewed. "C2-3 shows facet arthropathy and ankylosis on the right with mild foraminal narrowing. C3-4 bilobed disc protrusion with uncovertebral joint spurring  and facet arthropathy. Moderate severe left and moderate right foraminal stenosis. C4-5 spondylitic disc protrusion with uncovertebral joint spurs and facet arthropathy. Severe right and moderate left foraminal stenosis. C5-6 shows central mixed disc protrusion. C6-7 shows central soft disc protrusion indenting the thecal sac without cord impingement. Facet arthropathy asymmetric to the left. C7-T1 shows no compressive disc abnormality."  2. Hypertension, hyperlipidemia   Plan 1. Continue with exercises as learned in physical therapy. 2. Continue with Tylenol 650 mg p.o. every 8 hours as needed 3. Continue with occasional use of tramadol.  4. Schedule for right C4-5 vs C5-6 TF ESI X2 5. He will follow-up 3 weeks after the second epidural steroid injection for reevaluation.   On f/u 09/24/20:   He was initially evaluated on 07/31/2020 at which time he was scheduled for a right C4-5 transforaminal ESI x2. Today he reports over 90% improvement following the epidural steroid injections. With his job he frequently has to look up in the cervical extension can cause intermittent paresthesias in the right upper extremity. Overall his pain is dramatically improved and he is very happy with this. He is not interested any treatment changes.   OSA: Heremainscompliant with his CPAP machine. He wakes up feeling well rested. He is using it approximately 6 hours per night. He thinks it has helped him feel better in particular with regard to his snoring.  06/28/2019 -sleep study completed: Patient underwent a one night Home Sleep Test and by behavioral criteria TREATMENT  CONSIDERATIONS: 1)Nasal continuous positive airway pressure (CPAP) is the first treatment option based on the AHI severity and co-morbidities. 2)Recommend AutoPAP 5 to 20 cm H2O pressure. 3)The patient should avoid sleeping supine given non-supine AHI is in the normal range.  HTN: Medication regimen -metoprolol XL  - 50mg  daily ,amlodipine  5mg  daily Checks occasionally at home and have been good BP Readings from Last 3 Encounters:  10/08/20 130/80  05/25/20 120/74  03/08/20 130/68   Denies chest pains, palpitations, shortness of breath, increased lower extremity swelling, headaches, vision changes  GERD: Medication regimen -omeprazole 40 mg every morning, 20mg  qpm  Noted this works well for him as long as he avoids spicy foods, noted previously [employer] [place] food. Noted last visit and again today trying to lessen this over time as a goal, and reviewed concerns with chronic PPI use that have arisen in the more recent past. Notes still needs to use for GERD control.  Continue to monitor Also reviewed reflux precautions and the importance of this and management.  Psoriasis/Psoriatic arthritis: Medication regimen-presently none Had seen rheum in the past.Not in a while (prior embrel and Humira). Still has arthritis in his fingers, and ball of foot. Able to function during dayand not very limiting. Takes an OTC med from [place] (equilac 1200mg ) for psoriasis and keeps well controlled. Medicare and cost an issue. Also takes Tylenol arthritis 8-hour - 650 mg, 2 in the morning and 2 at night.  Insomnia: Medication regimen -melatonin-5 mg at night, sometimes half to 1 of Benadryl nightly He was able to successfully stop the Ambien noted last visit Is been doing fairly well with this regimen presently  Hyperlipidemia Medication regimen-none  Noted doing well trying to eat a healthier diet. Lipid panel - 11/28/19 - TC-145, LDL - 74in [facility] lab  Hyperglycemia - 05/09/19 visit with prior provider - FBS -122, A1c -5.5 on 11/28/19  Prostate cancer screening/ED A sildenafil product was prescribed prior to use as needed, and was on a similar type product many years ago. Notes not help when first tried, although was not certain if he tried to increase the dose of the 20 mg of sildenafil as recommended, as not used in recent  past.  Recent Labs       Lab Results  Component Value Date   PSA 2.3 05/05/2017   PSA 1.48 04/15/2016     PSA - 2.54 - 04/2019  Colon cancer screening-had colonoscopy- 11/25/16, two small polyps noted, rec'ed in about 5 years per patient.  He noted he is friends with Dr. Jeannette Corpus, GI'ist   Comp panel - 11/28/19 - normal CBC 11/28/19 - okand reviewed  Wt Readings from Last 3 Encounters:  10/08/20 175 lb 12.8 oz (79.7 kg)  05/25/20 165 lb 4.8 oz (75 kg)  03/08/20 157 lb (71.2 kg)    Has been successful with weight loss in the recent past, with weightfurther increased after our last visit in August where it had increased some. Holidays contributing and encouraged helathy weight maintenance Diet-tries to eat a healthy diet Exercise - very active with work [place]      Patient Active Problem List   Diagnosis Date Noted  . Overweight 05/25/2020  . Polyp of colon 05/23/2020  . Obstructive sleep apnea syndrome 01/24/2020  . Arthritis with psoriasis ([place]) 01/24/2020  . Erectile dysfunction 01/24/2020  . Solitary pulmonary nodule on lung CT 09/03/2016  . Hyperglycemia 03/06/2016  . Insomnia 08/28/2015  . Essential hypertension 08/28/2015  .  Mixed hyperlipidemia 08/28/2015  . Allergic rhinitis due to pollen 08/28/2015  . Gastro-esophageal reflux disease without esophagitis 12/10/2014      Current Outpatient Medications:  .  acetaminophen (TYLENOL) 650 MG CR tablet, Take 2 tablets by mouth 2 (two) times daily., Disp: , Rfl:  .  amLODipine (NORVASC) 5 MG tablet, Take 1 tablet (5 mg total) by mouth daily., Disp: 90 tablet, Rfl: 0 .  diphenhydrAMINE (BENADRYL ALLERGY) 25 mg capsule, Take by mouth., Disp: , Rfl:  .  fluticasone (FLONASE) 50 MCG/ACT nasal spray, Place 2 sprays into both nostrils daily., Disp: 16 g, Rfl: 6 .  Magnesium 500 MG CAPS, Take by mouth., Disp: , Rfl:  .  melatonin 5 MG TABS, Take 5 mg by mouth., Disp: , Rfl:  .  metoprolol  succinate (TOPROL-XL) 50 MG 24 hr tablet, Take 1 tablet by mouth daily., Disp: , Rfl:  .  Multiple Vitamins-Minerals (MULTIVITAMIN WITH MINERALS) tablet, Take 1 tablet by mouth daily., Disp: , Rfl:  .  omeprazole (PRILOSEC) 40 MG capsule, TAKE 1 CAPSULE BY MOUTH ONCE DAILY, Disp: 90 capsule, Rfl: 1 .  pimecrolimus (ELIDEL) 1 % cream, Apply to affected areas psoriasis 1-2 times daily as needed., Disp: 60 g, Rfl: 2 .  Probiotic Product (PROBIOTIC ADVANCED PO), Take by mouth., Disp: , Rfl:  .  traMADol (ULTRAM) 50 MG tablet, 1/2-1 po bid prn, Disp: , Rfl:  .  UNABLE TO FIND, Take by mouth. equilac 1200 mg for psoriasis, Disp: , Rfl:    Allergies  Allergen Reactions  . Alcohol Other (See Comments)  . Erythromycin Base   . Lisinopril Other (See Comments)    cough  . Other Other (See Comments)    Mycins = chest pain, [place] = skin test results.   Nilsa Nutting Alcohol  [Lanolin] Other (See Comments)  . Clobetasol Rash    Severe blisters      Past Surgical History:  Procedure Laterality Date  . HAND SURGERY       Family History  Problem Relation Age of Onset  . Cancer Mother   . Hearing loss Mother   . Heart attack Father   . Cancer Father   . Heart disease Father   . Hypertension Father   . Heart attack Brother   . Heart disease Brother   . ADD / ADHD Daughter   . Asthma Daughter   . Anxiety disorder Daughter   . Asthma Daughter   . Arthritis Maternal Grandmother      Social History   Tobacco Use  . Smoking status: Never Smoker  . Smokeless tobacco: Never Used  Substance Use Topics  . Alcohol use: Yes    Alcohol/week: 0.0 standard drinks    With staff assistance, above reviewed with the patient today.  ROS: As per HPI, otherwise no specific complaints on a limited and focused system review   No results found for this or any previous visit (from the past 72 hour(s)).   PHQ2/9: Depression screen [facility] 2/9 09/20/2020 03/08/2020 01/24/2020 10/15/2017 06/02/2017   Decreased Interest 0 0 0 0 0  Down, Depressed, Hopeless 0 0 0 0 0  PHQ - 2 Score 0 0 0 0 0  Altered sleeping - - 1 - -  Tired, decreased energy - - 3 - -  Change in appetite - - 0 - -  Feeling bad or failure about yourself  - - 0 - -  Trouble concentrating - - 0 - -  Moving  slowly or fidgety/restless - - 0 - -  Suicidal thoughts - - 0 - -  PHQ-9 Score - - 4 - -  Difficult doing work/chores - - Not difficult at all - -   PHQ-2/9 Result is neg  Fall Risk: Fall Risk  10/08/2020 09/20/2020 05/25/2020 03/08/2020 01/24/2020  Falls in the past year? 0 0 0 0 0  Number falls in past yr: 0 0 0 0 0  Injury with Fall? 0 0 0 0 0  Risk for fall due to : - History of fall(s) - No Fall Risks -  Follow up - Falls prevention discussed - Falls prevention discussed -      Objective:   Vitals:   10/08/20 1000  BP: 130/80  Pulse: 64  Resp: 16  Temp: 98.2 F (36.8 C)  TempSrc: Oral  SpO2: 96%  Weight: 175 lb 12.8 oz (79.7 kg)  Height: 5\' 6"  (1.676 m)    Body mass index is 28.37 kg/m.  Physical Exam   NAD, masked, pleasant HEENT - [place]/AT, sclera anicteric,+ glasses,PERRL, EOMI, conj - non-inj'ed, + hearing aids,pharynx clear Neck - supple, no adenopathy, carotids 2+ and = without bruits bilat,  Car - RRR without m/g/r Pulm- RR and effort normal at rest, CTA without wheeze or rales Abd - soft, NT diffusely, protuberant,  Back - no CVA tenderness Ext - no LE edema,is status post finger amputation of the fifth digit on the right handatthe DIP joint, and also the fourth digit of the right hand past the DIP joint,  Neuro/psychiatric - affect was not flat, appropriate with conversation Alert and oriented Grossly non-focal Speech normal Results for orders placed or performed in visit on [id number]  BASIC METABOLIC PANEL WITH GFR  Result Value Ref Range   Glucose, Bld 114 65 - 139 mg/dL   BUN 24 7 - 25 mg/dL   Creat 1.04 0.70 - 1.25 mg/dL   GFR,  Est Non African American 73 > OR = 60 mL/min/1.72m2   GFR, Est African American 85 > OR = 60 mL/min/1.29m2   BUN/Creatinine Ratio NOT APPLICABLE 6 - 22 (calc)   Sodium 138 135 - 146 mmol/L   Potassium 4.6 3.5 - 5.3 mmol/L   Chloride 103 98 - 110 mmol/L   CO2 25 20 - 32 mmol/L   Calcium 9.9 8.6 - 10.3 mg/dL       Assessment & Plan:     1. Essential hypertension Blood pressure remains well controlled on his current medication regimen  2. Mixed hyperlipidemia/overweight Last lipid panel reviewed, from February 2021,  LDL was 61 Continuing the dietary modifications to help, and staying active important. Plan was to recheck again in February. Discussed the importance of healthy weight maintenance, and trying to ensure not continuing to gain weight over time.  He noted he has been able to keep his weight fairly stable over time, and the importance of that in the near future emphasized today.  3. Obstructive sleep apnea syndrome Continue CPAP.  Doing well  4. Arthritis with psoriasis (HCC)/DDD cervical spine with neck pain Notes remains much improved after management with PM&R and an injection received as noted above.  5. Insomnia, unspecified type Congratulated on stopping the Ambien product and to continue off of this medicine Can continue using melatonin and/or Benadryl products presently to help.  6. Gastro-esophageal reflux disease without esophagitis Continue the PPI presently Noted today trying to lessen this over time as a goal, and reviewed concerns with chronic PPI use  that have arisen in the more recent past. Also reviewed reflux precautions again and the importance of these.  7 Erectile dysfunction, unspecified erectile dysfunction type Did prescribe the sildenafil prior, although he used briefly, and cannot recall if he increased the dose as recommended. Agreed to try a prescription Viagra product which is 50 mg and to use as needed prior to planned activities  and assess his response.   He received a flu vaccine today as well as the 23 polyvalent pneumococcal vaccine.  Planned to recheck labs again around February, and will recheck on his next follow-up visit in 3 months time, can follow-up sooner as needed. He is aware that his follow-up visit will be with a new provider as I will be leaving this practice prior to that time.      Jamelle Haring, MD 10/08/20 10:02 AM

## 2020-10-08 ENCOUNTER — Other Ambulatory Visit: Payer: Self-pay

## 2020-10-08 ENCOUNTER — Encounter: Payer: Self-pay | Admitting: Internal Medicine

## 2020-10-08 ENCOUNTER — Ambulatory Visit (INDEPENDENT_AMBULATORY_CARE_PROVIDER_SITE_OTHER): Payer: Medicare Other | Admitting: Internal Medicine

## 2020-10-08 VITALS — BP 130/80 | HR 64 | Temp 98.2°F | Resp 16 | Ht 66.0 in | Wt 175.8 lb

## 2020-10-08 DIAGNOSIS — M542 Cervicalgia: Secondary | ICD-10-CM | POA: Diagnosis not present

## 2020-10-08 DIAGNOSIS — K219 Gastro-esophageal reflux disease without esophagitis: Secondary | ICD-10-CM | POA: Diagnosis not present

## 2020-10-08 DIAGNOSIS — N529 Male erectile dysfunction, unspecified: Secondary | ICD-10-CM

## 2020-10-08 DIAGNOSIS — G4733 Obstructive sleep apnea (adult) (pediatric): Secondary | ICD-10-CM | POA: Diagnosis not present

## 2020-10-08 DIAGNOSIS — G4700 Insomnia, unspecified: Secondary | ICD-10-CM | POA: Diagnosis not present

## 2020-10-08 DIAGNOSIS — Z23 Encounter for immunization: Secondary | ICD-10-CM | POA: Diagnosis not present

## 2020-10-08 DIAGNOSIS — I10 Essential (primary) hypertension: Secondary | ICD-10-CM | POA: Diagnosis not present

## 2020-10-08 DIAGNOSIS — E782 Mixed hyperlipidemia: Secondary | ICD-10-CM

## 2020-10-08 DIAGNOSIS — L4050 Arthropathic psoriasis, unspecified: Secondary | ICD-10-CM | POA: Diagnosis not present

## 2020-10-08 MED ORDER — SILDENAFIL CITRATE 50 MG PO TABS: 50.0000 mg | ORAL_TABLET | Freq: Every day | ORAL | 2 refills | Status: DC | PRN

## 2020-10-09 ENCOUNTER — Other Ambulatory Visit: Payer: Self-pay

## 2020-10-09 DIAGNOSIS — L409 Psoriasis, unspecified: Secondary | ICD-10-CM

## 2020-10-09 MED ORDER — PIMECROLIMUS 1 % EX CREA: TOPICAL_CREAM | CUTANEOUS | 2 refills | Status: DC

## 2020-10-09 NOTE — Progress Notes (Signed)
Written prescription for Elidel given to patient today/hd

## 2020-10-23 ENCOUNTER — Ambulatory Visit: Payer: Medicare Other

## 2020-11-05 ENCOUNTER — Telehealth: Payer: Self-pay

## 2020-11-05 NOTE — Telephone Encounter (Signed)
Pimecrolimus 1% cream was denied by insurance. Pt must have tried and failed triamcinolone 0.025%, 0.1%, 0.5%, mometasone, or betamethasone dipropionate.

## 2020-11-12 ENCOUNTER — Other Ambulatory Visit: Payer: Self-pay

## 2020-11-12 DIAGNOSIS — L409 Psoriasis, unspecified: Secondary | ICD-10-CM

## 2020-11-12 MED ORDER — MOMETASONE FUROATE 0.1 % EX CREA: TOPICAL_CREAM | CUTANEOUS | 2 refills | Status: DC

## 2020-11-12 NOTE — Telephone Encounter (Signed)
May send Mometasone cream bid prn aa rash and psoriasis up to 5 days per week  Disp 30 g with 2 rf

## 2020-11-12 NOTE — Telephone Encounter (Signed)
Should be fine to give Rx for Mometasone.

## 2020-11-12 NOTE — Telephone Encounter (Signed)
Mometasone pops up with an allergy/contraindacation to clobetasol with a rash reaction.

## 2020-11-22 ENCOUNTER — Encounter: Payer: Medicare Other | Admitting: Internal Medicine

## 2020-12-03 ENCOUNTER — Encounter: Payer: Self-pay | Admitting: Dermatology

## 2020-12-03 ENCOUNTER — Ambulatory Visit (INDEPENDENT_AMBULATORY_CARE_PROVIDER_SITE_OTHER): Payer: Medicare Other | Admitting: Dermatology

## 2020-12-03 ENCOUNTER — Other Ambulatory Visit: Payer: Self-pay

## 2020-12-03 DIAGNOSIS — L578 Other skin changes due to chronic exposure to nonionizing radiation: Secondary | ICD-10-CM

## 2020-12-03 DIAGNOSIS — L409 Psoriasis, unspecified: Secondary | ICD-10-CM | POA: Diagnosis not present

## 2020-12-03 DIAGNOSIS — Z85828 Personal history of other malignant neoplasm of skin: Secondary | ICD-10-CM

## 2020-12-03 DIAGNOSIS — T1490XA Injury, unspecified, initial encounter: Secondary | ICD-10-CM

## 2020-12-03 DIAGNOSIS — Z872 Personal history of diseases of the skin and subcutaneous tissue: Secondary | ICD-10-CM | POA: Diagnosis not present

## 2020-12-03 NOTE — Progress Notes (Signed)
   Follow-Up Visit   Subjective  Christian Santos is a 70 y.o. male who presents for the following: Actinic Keratosis (L temple - check for persistence), history of BCC (R temple/hairline - check for persistence), and psoriasis (Patient currently using Mometasone since Elidel was too expensive and OTC medication PRN).  The following portions of the chart were reviewed this encounter and updated as appropriate:   Tobacco  Allergies  Meds  Problems  Med Hx  Surg Hx  Fam Hx     Review of Systems:  No other skin or systemic complaints except as noted in HPI or Assessment and Plan.  Objective  Well appearing patient in no apparent distress; mood and affect are within normal limits.  A focused examination was performed including face, arms, and hands. Relevant physical exam findings are noted in the Assessment and Plan.  Objective  Above right lat brow: Crust   Objective  L temple: Clear.  Objective  R temple/hairline: Clear   Objective  Trunk, extremities: Clear   Assessment & Plan  Traumatic injury - crust Above right lat brow Benign appearing - RTC if not resolved after 6 weeks  History of actinic keratosis L temple Clear. Observe for recurrence. Call clinic for new or changing lesions.  Recommend regular skin exams, daily broad-spectrum spf 30+ sunscreen use, and photoprotection.    History of basal cell carcinoma R temple/hairline Clear. Observe for recurrence. Call clinic for new or changing lesions.  Recommend regular skin exams, daily broad-spectrum spf 30+ sunscreen use, and photoprotection.    Actinic Damage - chronic, secondary to cumulative UV radiation exposure/sun exposure over time - diffuse scaly erythematous macules with underlying dyspigmentation - Recommend daily broad spectrum sunscreen SPF 30+ to sun-exposed areas, reapply every 2 hours as needed.  - Call for new or changing lesions.  Psoriasis with psoriatic arthritis and osteoarthritis.   Skin currently controlled on topicals.  Pt also taking "mare's milk / Equilac" which seems to help.  Previously on systemic biologic injections, now off for over a year. Trunk, extremities Psoriasis is a chronic non-curable, but treatable genetic/hereditary disease that may have other systemic features affecting other organ systems such as joints (Psoriatic Arthritis). It is associated with an increased risk of inflammatory bowel disease, heart disease, non-alcoholic fatty liver disease, and depression.    Continue Mometastone 0.1% cream to aa's QD PRN up to 5d/wk. Topical steroids (such as triamcinolone, fluocinolone, fluocinonide, mometasone, clobetasol, halobetasol, betamethasone, hydrocortisone) can cause thinning and lightening of the skin if they are used for too long in the same area. Your physician has selected the right strength medicine for your problem and area affected on the body. Please use your medication only as directed by your physician to prevent side effects.   Ok to continue Equilac OTC since patient feels this has helped.  Other Related Medications pimecrolimus (ELIDEL) 1 % cream mometasone (ELOCON) 0.1 % cream  Return in about 6 months (around 06/02/2021) for TBSE- hx of BCC, AK's.  Luther Redo, CMA, am acting as scribe for Sarina Ser, MD .  Documentation: I have reviewed the above documentation for accuracy and completeness, and I agree with the above.  Sarina Ser, MD

## 2021-01-11 ENCOUNTER — Telehealth: Payer: Medicare Other | Admitting: Physician Assistant

## 2021-01-11 DIAGNOSIS — B9689 Other specified bacterial agents as the cause of diseases classified elsewhere: Secondary | ICD-10-CM | POA: Diagnosis not present

## 2021-01-11 DIAGNOSIS — J069 Acute upper respiratory infection, unspecified: Secondary | ICD-10-CM

## 2021-01-11 MED ORDER — BENZONATATE 100 MG PO CAPS: 100.0000 mg | ORAL_CAPSULE | Freq: Three times a day (TID) | ORAL | 0 refills | Status: DC | PRN

## 2021-01-11 MED ORDER — DOXYCYCLINE HYCLATE 100 MG PO TABS: 100.0000 mg | ORAL_TABLET | Freq: Two times a day (BID) | ORAL | 0 refills | Status: DC

## 2021-01-11 NOTE — Progress Notes (Signed)
I have spent 5 minutes in review of e-visit questionnaire, review and updating patient chart, medical decision making and response to patient.   Yessica Putnam Cody Raychell Holcomb, PA-C    

## 2021-01-11 NOTE — Progress Notes (Signed)

## 2021-01-17 DIAGNOSIS — M7021 Olecranon bursitis, right elbow: Secondary | ICD-10-CM | POA: Diagnosis not present

## 2021-01-17 DIAGNOSIS — M7989 Other specified soft tissue disorders: Secondary | ICD-10-CM | POA: Diagnosis not present

## 2021-01-17 DIAGNOSIS — M25421 Effusion, right elbow: Secondary | ICD-10-CM | POA: Diagnosis not present

## 2021-02-01 DIAGNOSIS — M7021 Olecranon bursitis, right elbow: Secondary | ICD-10-CM | POA: Diagnosis not present

## 2021-02-14 DIAGNOSIS — H25813 Combined forms of age-related cataract, bilateral: Secondary | ICD-10-CM | POA: Diagnosis not present

## 2021-04-22 ENCOUNTER — Ambulatory Visit: Payer: Medicare Other | Admitting: Family Medicine

## 2021-04-29 ENCOUNTER — Other Ambulatory Visit: Payer: Self-pay

## 2021-04-29 ENCOUNTER — Ambulatory Visit (INDEPENDENT_AMBULATORY_CARE_PROVIDER_SITE_OTHER): Payer: Medicare Other | Admitting: Family Medicine

## 2021-04-29 ENCOUNTER — Encounter: Payer: Self-pay | Admitting: Family Medicine

## 2021-04-29 VITALS — BP 128/82 | HR 68 | Temp 98.1°F | Resp 16 | Ht 66.0 in | Wt 179.8 lb

## 2021-04-29 DIAGNOSIS — I10 Essential (primary) hypertension: Secondary | ICD-10-CM | POA: Diagnosis not present

## 2021-04-29 DIAGNOSIS — E782 Mixed hyperlipidemia: Secondary | ICD-10-CM

## 2021-04-29 DIAGNOSIS — G4733 Obstructive sleep apnea (adult) (pediatric): Secondary | ICD-10-CM | POA: Diagnosis not present

## 2021-04-29 DIAGNOSIS — K219 Gastro-esophageal reflux disease without esophagitis: Secondary | ICD-10-CM | POA: Diagnosis not present

## 2021-04-29 DIAGNOSIS — N529 Male erectile dysfunction, unspecified: Secondary | ICD-10-CM | POA: Diagnosis not present

## 2021-04-29 DIAGNOSIS — L4050 Arthropathic psoriasis, unspecified: Secondary | ICD-10-CM

## 2021-04-29 LAB — BASIC METABOLIC PANEL
BUN: 17 mg/dL (ref 7–25)
CO2: 27 mmol/L (ref 20–32)
Calcium: 9.5 mg/dL (ref 8.6–10.3)
Chloride: 106 mmol/L (ref 98–110)
Creat: 0.85 mg/dL (ref 0.70–1.35)
Glucose, Bld: 115 mg/dL — ABNORMAL HIGH (ref 65–99)
Potassium: 4.6 mmol/L (ref 3.5–5.3)
Sodium: 140 mmol/L (ref 135–146)

## 2021-04-29 LAB — LIPID PANEL
Cholesterol: 150 mg/dL (ref <200)
HDL: 36 mg/dL — ABNORMAL LOW (ref >=40)
LDL Cholesterol (Calc): 86 mg/dL (calc)
Non-HDL Cholesterol (Calc): 114 mg/dL (calc) (ref <130)
Total CHOL/HDL Ratio: 4.2 (calc) (ref <5.0)
Triglycerides: 189 mg/dL — ABNORMAL HIGH (ref <150)

## 2021-04-29 MED ORDER — AMLODIPINE BESYLATE 5 MG PO TABS: 5.0000 mg | ORAL_TABLET | Freq: Every day | ORAL | 3 refills | Status: AC

## 2021-04-29 MED ORDER — OMEPRAZOLE 40 MG PO CPDR: 40.0000 mg | DELAYED_RELEASE_CAPSULE | Freq: Every day | ORAL | 1 refills | Status: DC

## 2021-04-29 NOTE — Assessment & Plan Note (Signed)
Doing well on current regimen. Encouraged to dose reduce as able to prevent long term adverse effects from PPI.

## 2021-04-29 NOTE — Patient Instructions (Addendum)
It was great to see you!  Our plans for today:  - I recommend you receive your COVID booster. You can get this at your pharmacy if interested.  - No changes to your medications.   We are checking some labs today, we will release these results to your MyChart.  Take care and seek immediate care sooner if you develop any concerns.   Dr. Ky Barban

## 2021-04-29 NOTE — Assessment & Plan Note (Signed)
Intolerant to statins. Recheck labs today.

## 2021-04-29 NOTE — Assessment & Plan Note (Signed)
Doing well on current regimen, no changes made today. Obtaining labs. 

## 2021-04-29 NOTE — Assessment & Plan Note (Signed)
Encouraged compliance with CPAP.

## 2021-04-29 NOTE — Assessment & Plan Note (Signed)
Doing well on current regimen, no changes made today. 

## 2021-04-29 NOTE — Assessment & Plan Note (Signed)
Currently doing well. Continue to follow with PM&R.

## 2021-04-29 NOTE — Progress Notes (Signed)
    SUBJECTIVE:   CHIEF COMPLAINT / HPI:   Hypertension, OSA: - Medications: norvasc '5mg'$ , metoprolol '50mg'$  daily,  - Compliance: good - Checking BP at home: occasionally, "in normal range" - Denies any SOB, CP, vision changes, LE edema, medication SEs, or symptoms of hypotension - CPAP? No. Quit about 4-6 mths ago, wasn't noticing difference.  GERD - Meds: prilosec '40mg'$  in am, '20mg'$  in pm - Symptoms:  no other symptoms.  - denies dysphagia has not lost weight denies melena, hematochezia, hematemesis, and coffee ground emesis.   HLD - medications: none. Previously intolerant to statins (crestor, lipitor). Before that was on fenofibrate. - compliance: n/a - medication SEs: n/a  Arthritis with cervical radiculopathy - with cervical foraminal narrowing on MRI. on tramadol prn, tylenol prn. Hasn't taken tramadol in some time. Previously received epidural steroid injections with relief. Previously seen by Ortho, PT, PM&R.  Seasonal allergies - on flonase as needed. Doing well. Dust.   ED - on viagra.   OBJECTIVE:   BP 128/82   Pulse 68   Temp 98.1 F (36.7 C) (Oral)   Resp 16   Ht '5\' 6"'$  (1.676 m)   Wt 179 lb 12.8 oz (81.6 kg)   SpO2 98%   BMI 29.02 kg/m   Gen: well appearing, in NAD Card: RRR Lungs: CTAB Ext: WWP, no edema  ASSESSMENT/PLAN:   Essential hypertension Doing well on current regimen, no changes made today. Obtaining labs.   Obstructive sleep apnea syndrome Encouraged compliance with CPAP.  Gastro-esophageal reflux disease without esophagitis Doing well on current regimen. Encouraged to dose reduce as able to prevent long term adverse effects from PPI.  Arthritis with psoriasis ([place]) Currently doing well. Continue to follow with PM&R.   Mixed hyperlipidemia Intolerant to statins. Recheck labs today.  Erectile dysfunction Doing well on current regimen, no changes made today.     Myles Gip, DO

## 2021-05-06 ENCOUNTER — Other Ambulatory Visit: Payer: Self-pay | Admitting: Family Medicine

## 2021-05-06 MED ORDER — EZETIMIBE 10 MG PO TABS: 10.0000 mg | ORAL_TABLET | Freq: Every day | ORAL | 3 refills | Status: AC

## 2021-06-06 ENCOUNTER — Ambulatory Visit (INDEPENDENT_AMBULATORY_CARE_PROVIDER_SITE_OTHER): Payer: Medicare Other

## 2021-06-06 ENCOUNTER — Other Ambulatory Visit: Payer: Self-pay

## 2021-06-06 ENCOUNTER — Ambulatory Visit
Admission: EM | Admit: 2021-06-06 | Discharge: 2021-06-06 | Disposition: A | Discharge status: Home / Self Care | Payer: Medicare Other | Attending: Emergency Medicine | Admitting: Emergency Medicine

## 2021-06-06 ENCOUNTER — Encounter: Payer: Self-pay | Admitting: Emergency Medicine

## 2021-06-06 DIAGNOSIS — M25572 Pain in left ankle and joints of left foot: Secondary | ICD-10-CM

## 2021-06-06 DIAGNOSIS — M7989 Other specified soft tissue disorders: Secondary | ICD-10-CM | POA: Diagnosis not present

## 2021-06-06 DIAGNOSIS — S8262XA Displaced fracture of lateral malleolus of left fibula, initial encounter for closed fracture: Secondary | ICD-10-CM

## 2021-06-06 DIAGNOSIS — M25472 Effusion, left ankle: Secondary | ICD-10-CM

## 2021-06-06 DIAGNOSIS — I10 Essential (primary) hypertension: Secondary | ICD-10-CM

## 2021-06-06 DIAGNOSIS — S82892A Other fracture of left lower leg, initial encounter for closed fracture: Secondary | ICD-10-CM

## 2021-06-06 DIAGNOSIS — S82832A Other fracture of upper and lower end of left fibula, initial encounter for closed fracture: Secondary | ICD-10-CM | POA: Diagnosis not present

## 2021-06-06 NOTE — ED Triage Notes (Signed)
Pt here after twisting ankle on curb this morning. Can put weight on it, but limited ROM

## 2021-06-06 NOTE — Discharge Instructions (Addendum)
Take Tylenol or ibuprofen as needed for pain.  Rest and elevate your ankle.  Apply ice packs 2-3 times a day for up to 20 minutes each.  Wear the walking boot and use the crutches.    Follow up with an orthopedist such as the one listed below.    Your blood pressure is elevated today at 167/91.  Please have this rechecked by your primary care provider in 2-4 weeks.

## 2021-06-06 NOTE — ED Provider Notes (Signed)
Christian Santos    CSN: [id number] Arrival date & time: 06/06/21  1258      History   Chief Complaint Chief Complaint  Patient presents with   Ankle Pain     HPI Christian Santos is a 70 y.o. male.  Patient presents with pain and swelling of his left ankle after he twisted his ankle while stepping through a doorway this morning.  The pain is worse with weightbearing.  He denies numbness, weakness, paresthesias, open wounds, redness, bruising, or other symptoms.  His medical history includes hypertension.  The history is provided by the patient and medical records.   Past Medical History:  Diagnosis Date   Actinic keratosis 05/28/2020   L temple    Allergy 2017   Arthritis 2017   Basal cell carcinoma 05/28/2020   R temple/hairline - ED&C    Dysplastic nevus 02/20/2014   Left posterior shoulder. Moderate atypia, limited margins free.   GERD (gastroesophageal reflux disease)    Hyperlipidemia    Hypertension    Insomnia    Polycythemia vera ([place])    Psoriasis    Sleep apnea 2020    Patient Active Problem List   Diagnosis Date Noted   Overweight 05/25/2020   Polyp of colon 05/23/2020   Obstructive sleep apnea syndrome 01/24/2020   Arthritis with psoriasis ([place]) 01/24/2020   Erectile dysfunction 01/24/2020   Solitary pulmonary nodule on lung CT 09/03/2016   Hyperglycemia 03/06/2016   Insomnia 08/28/2015   Essential hypertension 08/28/2015   Mixed hyperlipidemia 08/28/2015   Allergic rhinitis due to pollen 08/28/2015   Gastro-esophageal reflux disease without esophagitis 12/10/2014    Past Surgical History:  Procedure Laterality Date   HAND SURGERY         Home Medications    Prior to Admission medications   Medication Sig Start Date End Date Taking? Authorizing Provider  acetaminophen (TYLENOL) 650 MG CR tablet Take 2 tablets by mouth 2 (two) times daily.    [provider]  amLODipine (NORVASC) 5 MG tablet Take 1 tablet (5 mg total)  by mouth daily. 04/29/21   Myles Gip, DO  diphenhydrAMINE (BENADRYL) 25 mg capsule Take by mouth.    [provider]  ezetimibe (ZETIA) 10 MG tablet Take 1 tablet (10 mg total) by mouth daily. 05/06/21   Myles Gip, DO  fluticasone (FLONASE) 50 MCG/ACT nasal spray Place 2 sprays into both nostrils daily. 08/28/15   Ashok Norris, MD  Magnesium 500 MG CAPS Take by mouth.    [provider]  melatonin 5 MG TABS Take 5 mg by mouth.    [provider]  metoprolol succinate (TOPROL-XL) 50 MG 24 hr tablet Take 1 tablet by mouth daily. 09/01/20   [provider]  mometasone (ELOCON) 0.1 % cream Apply twice a day as needed to affected areas for rash and psoriasis, up to 5 days per week 11/12/20   Ralene Bathe, MD  Multiple Vitamins-Minerals (MULTIVITAMIN WITH MINERALS) tablet Take 1 tablet by mouth daily.    [provider]  omeprazole (PRILOSEC) 40 MG capsule Take 1 capsule (40 mg total) by mouth daily. 04/29/21   Myles Gip, DO  pimecrolimus (ELIDEL) 1 % cream Apply to affected areas psoriasis 1-2 times daily as needed. 10/09/20   Ralene Bathe, MD  Probiotic Product (PROBIOTIC ADVANCED PO) Take by mouth.    [provider]  sildenafil (VIAGRA) 50 MG tablet Take 1 tablet (50 mg total) by mouth  daily as needed for erectile dysfunction. 10/08/20   Towanda Malkin, MD  traMADol Veatrice Bourbon) 50 MG tablet 1/2-1 po bid prn 09/25/20   [provider]  UNABLE TO FIND Take by mouth. equilac 1200 mg for psoriasis    [provider]    Family History Family History  Problem Relation Age of Onset   Cancer Mother    Hearing loss Mother    Heart attack Father    Cancer Father    Heart disease Father    Hypertension Father    Heart attack Brother    Heart disease Brother    ADD / ADHD Daughter    Asthma Daughter    Anxiety disorder Daughter    Asthma Daughter    Arthritis Maternal Grandmother     Social  History Social History   Tobacco Use   Smoking status: Never   Smokeless tobacco: Never  Vaping Use   Vaping Use: Never used  Substance Use Topics   Alcohol use: Yes    Alcohol/week: 0.0 standard drinks   Drug use: No     Allergies   Alcohol, Erythromycin base, Lisinopril, Other, Wool alcohol  [lanolin], and Clobetasol   Review of Systems Review of Systems  Constitutional:  Negative for chills and fever.  Respiratory:  Negative for cough and shortness of breath.   Cardiovascular:  Negative for chest pain and palpitations.  Gastrointestinal:  Negative for nausea and vomiting.  Musculoskeletal:  Positive for arthralgias, gait problem and joint swelling.  Skin:  Negative for color change and rash.  Neurological:  Negative for weakness and numbness.  All other systems reviewed and are negative.   Physical Exam Triage Vital Signs ED Triage Vitals [06/06/21 1307]  Enc Vitals Group     BP (!) 167/91     Pulse Rate 79     Resp 18     Temp 98.8 F (37.1 C)     Temp Source Oral     SpO2 96 %     Weight      Height      Head Circumference      Peak Flow      Pain Score      Pain Loc      Pain Edu?      Excl. in Industry?    No data found.  Updated Vital Signs BP (!) 167/91 (BP Location: Left Arm)   Pulse 79   Temp 98.8 F (37.1 C) (Oral)   Resp 18   SpO2 96%   Visual Acuity Right Eye Distance:   Left Eye Distance:   Bilateral Distance:    Right Eye Near:   Left Eye Near:    Bilateral Near:     Physical Exam Vitals and nursing note reviewed.  Constitutional:      General: He is not in acute distress.    Appearance: He is well-developed.  HENT:     Head: Normocephalic and atraumatic.     Mouth/Throat:     Mouth: Mucous membranes are moist.  Eyes:     Conjunctiva/sclera: Conjunctivae normal.  Cardiovascular:     Rate and Rhythm: Normal rate and regular rhythm.     Heart sounds: Normal heart sounds.  Pulmonary:     Effort: Pulmonary effort is normal. No  respiratory distress.     Breath sounds: Normal breath sounds.  Abdominal:     Palpations: Abdomen is soft.     Tenderness: There is no abdominal tenderness.  Musculoskeletal:  General: Swelling and tenderness present. No deformity.     Cervical back: Neck supple.       Feet:  Skin:    General: Skin is warm and dry.     Capillary Refill: Capillary refill takes less than 2 seconds.     Findings: No bruising, erythema, lesion or rash.  Neurological:     General: No focal deficit present.     Mental Status: He is alert and oriented to person, place, and time.     Sensory: No sensory deficit.     Motor: No weakness.     Gait: Gait abnormal.  Psychiatric:        Mood and Affect: Mood normal.        Behavior: Behavior normal.     UC Treatments / Results  Labs (all labs ordered are listed, but only abnormal results are displayed) Labs Reviewed - No data to display  EKG   Radiology DG Ankle Complete Left  Result Date: 06/06/2021 CLINICAL DATA:  Lateral left ankle pain after fall EXAM: LEFT ANKLE COMPLETE - 3+ VIEW COMPARISON:  None. FINDINGS: Acute nondisplaced fracture through the inferior aspect of the lateral malleolus. No additional fractures. Ankle mortise is congruent. No dislocation. Mild soft tissue swelling overlies the lateral ankle. Vascular calcifications are present. IMPRESSION: Acute nondisplaced fracture through the inferior aspect of the lateral malleolus. Electronically Signed   By: Davina Poke D.O.   On: 06/06/2021 13:42    Procedures Procedures (including critical care time)  Medications Ordered in UC Medications - No data to display  Initial Impression / Assessment and Plan / UC Course  I have reviewed the triage vital signs and the nursing notes.  Pertinent labs & imaging results that were available during my care of the patient were reviewed by me and considered in my medical decision making (see chart for details).  Closed fracture of the  left malleolus.  Elevated blood pressure with known hypertension. X-ray shows "acute nondisplaced fracture through the inferior aspect of the lateral malleolus."  Treating with walking boot and crutches.  Also discussed rest, elevation, ice packs, Tylenol or ibuprofen.  Instructed patient to follow-up with an orthopedist. Also discussed that his blood pressure is elevated today and needs to be rechecked by his PCP in 2 to 4 weeks.  Education provided on managing hypertension.   Patient agrees to plan of care.   Final Clinical Impressions(s) / UC Diagnoses   Final diagnoses:  Closed fracture of malleolus of left ankle, initial encounter  Elevated blood pressure reading in office with diagnosis of hypertension     Discharge Instructions      Take Tylenol or ibuprofen as needed for pain.  Rest and elevate your ankle.  Apply ice packs 2-3 times a day for up to 20 minutes each.  Wear the walking boot and use the crutches.    Follow up with an orthopedist such as the one listed below.    Your blood pressure is elevated today at 167/91.  Please have this rechecked by your primary care provider in 2-4 weeks.          ED Prescriptions   None    PDMP not reviewed this encounter.   Sharion Balloon, NP 06/06/21 1355

## 2021-06-13 DIAGNOSIS — M25572 Pain in left ankle and joints of left foot: Secondary | ICD-10-CM | POA: Diagnosis not present

## 2021-06-24 ENCOUNTER — Ambulatory Visit (INDEPENDENT_AMBULATORY_CARE_PROVIDER_SITE_OTHER): Payer: Medicare Other | Admitting: Dermatology

## 2021-06-24 ENCOUNTER — Other Ambulatory Visit: Payer: Self-pay

## 2021-06-24 ENCOUNTER — Encounter: Payer: Self-pay | Admitting: Dermatology

## 2021-06-24 DIAGNOSIS — D229 Melanocytic nevi, unspecified: Secondary | ICD-10-CM

## 2021-06-24 DIAGNOSIS — Z85828 Personal history of other malignant neoplasm of skin: Secondary | ICD-10-CM

## 2021-06-24 DIAGNOSIS — L570 Actinic keratosis: Secondary | ICD-10-CM

## 2021-06-24 DIAGNOSIS — L820 Inflamed seborrheic keratosis: Secondary | ICD-10-CM

## 2021-06-24 DIAGNOSIS — Z1283 Encounter for screening for malignant neoplasm of skin: Secondary | ICD-10-CM

## 2021-06-24 DIAGNOSIS — L409 Psoriasis, unspecified: Secondary | ICD-10-CM

## 2021-06-24 DIAGNOSIS — L814 Other melanin hyperpigmentation: Secondary | ICD-10-CM | POA: Diagnosis not present

## 2021-06-24 DIAGNOSIS — L821 Other seborrheic keratosis: Secondary | ICD-10-CM | POA: Diagnosis not present

## 2021-06-24 DIAGNOSIS — D1800 Hemangioma unspecified site: Secondary | ICD-10-CM | POA: Diagnosis not present

## 2021-06-24 DIAGNOSIS — D235 Other benign neoplasm of skin of trunk: Secondary | ICD-10-CM | POA: Diagnosis not present

## 2021-06-24 DIAGNOSIS — Z86018 Personal history of other benign neoplasm: Secondary | ICD-10-CM | POA: Diagnosis not present

## 2021-06-24 DIAGNOSIS — L578 Other skin changes due to chronic exposure to nonionizing radiation: Secondary | ICD-10-CM

## 2021-06-24 MED ORDER — TACROLIMUS 0.1 % EX OINT: TOPICAL_OINTMENT | CUTANEOUS | 11 refills | Status: DC

## 2021-06-24 NOTE — Progress Notes (Signed)
Follow-Up Visit   Subjective  Christian Santos is a 70 y.o. male who presents for the following: Follow-up (6 month follow up tbse. Patient reports no new concerns. ).  The following portions of the chart were reviewed this encounter and updated as appropriate:  Tobacco  Allergies  Meds  Problems  Med Hx  Surg Hx  Fam Hx      Objective  Well appearing patient in no apparent distress; mood and affect are within normal limits.  A full examination was performed including scalp, head, eyes, ears, nose, lips, neck, chest, axillae, abdomen, back, buttocks, bilateral upper extremities, bilateral lower extremities, hands, feet, fingers, toes, fingernails, and toenails. All findings within normal limits unless otherwise noted below.  Right Elbow - Posterior   Left Ear x 1 Erythematous thin papules/macules with gritty scale.   right face x 5 (5) Erythematous keratotic or waxy stuck-on papule or plaque.   Assessment & Plan  Psoriasis Right Elbow - Posterior Psoriasis is a chronic non-curable, but treatable genetic/hereditary disease that may have other systemic features affecting other organ systems such as joints (Psoriatic Arthritis). It is associated with an increased risk of inflammatory bowel disease, heart disease, non-alcoholic fatty liver disease, and depression.   Right Elbow - Posterior May continue Elidel as needed and over the counter Equilac.   He should discuss treatment for osteoarthritis with his PCP  and recommend patient look into glucosamine chondroitin for joint pain. Ordered Medications: pimecrolimus (ELIDEL) 1 % cream  Protopic  tacrolimus (PROTOPIC) 0.1 % ointment - Right Elbow - Posterior Apply topically See admin instructions. Apply 1 - 2 times daily to hand for scale  Actinic keratosis Left Ear x 1 Actinic keratoses are precancerous spots that appear secondary to cumulative UV radiation exposure/sun exposure over time. They are chronic with  expected duration over 1 year. A portion of actinic keratoses will progress to squamous cell carcinoma of the skin. It is not possible to reliably predict which spots will progress to skin cancer and so treatment is recommended to prevent development of skin cancer.  Recommend daily broad spectrum sunscreen SPF 30+ to sun-exposed areas, reapply every 2 hours as needed.  Recommend staying in the shade or wearing long sleeves, sun glasses (UVA+UVB protection) and wide brim hats (4-inch brim around the entire circumference of the hat). Call for new or changing lesions.  Destruction of lesion - Left Ear x 1 Complexity: simple   Destruction method: cryotherapy   Informed consent: discussed and consent obtained   Timeout:  patient name, date of birth, surgical site, and procedure verified Lesion destroyed using liquid nitrogen: Yes   Region frozen until ice ball extended beyond lesion: Yes   Outcome: patient tolerated procedure well with no complications   Post-procedure details: wound care instructions given    Inflamed seborrheic keratosis right face x 5 Destruction of lesion - right face x 5 Complexity: simple   Destruction method: cryotherapy   Informed consent: discussed and consent obtained   Timeout:  patient name, date of birth, surgical site, and procedure verified Lesion destroyed using liquid nitrogen: Yes   Region frozen until ice ball extended beyond lesion: Yes   Outcome: patient tolerated procedure well with no complications   Post-procedure details: wound care instructions given    Skin cancer screening  Lentigines - Scattered tan macules - Due to sun exposure - Benign-appearing, observe - Recommend daily broad spectrum sunscreen SPF 30+ to sun-exposed areas, reapply every 2 hours as needed. -  Call for any changes  Seborrheic Keratoses - Stuck-on, waxy, tan-brown papules and/or plaques  - Benign-appearing - Discussed benign etiology and prognosis. - Observe - Call  for any changes  Melanocytic Nevi - Tan-brown and/or pink-flesh-colored symmetric macules and papules - Benign appearing on exam today - Observation - Call clinic for new or changing moles - Recommend daily use of broad spectrum spf 30+ sunscreen to sun-exposed areas.   Dermatofibroma - Firm pink/brown papulenodule with dimple sign at left scapula  - Benign appearing - Call for any changes  Hemangiomas - Red papules - Discussed benign nature - Observe - Call for any changes  Actinic Damage - Chronic condition, secondary to cumulative UV/sun exposure - diffuse scaly erythematous macules with underlying dyspigmentation - Recommend daily broad spectrum sunscreen SPF 30+ to sun-exposed areas, reapply every 2 hours as needed.  - Staying in the shade or wearing long sleeves, sun glasses (UVA+UVB protection) and wide brim hats (4-inch brim around the entire circumference of the hat) are also recommended for sun protection.  - Call for new or changing lesions.  History of Basal Cell Carcinoma of the Skin - No evidence of recurrence today at right temple/hairline 05/28/20 - Recommend regular full body skin exams - Recommend daily broad spectrum sunscreen SPF 30+ to sun-exposed areas, reapply every 2 hours as needed.  - Call if any new or changing lesions are noted between office visits  History of Dysplastic Nevi - No evidence of recurrence today Left posterior shoulder (2015) - Recommend regular full body skin exams - Recommend daily broad spectrum sunscreen SPF 30+ to sun-exposed areas, reapply every 2 hours as needed.  - Call if any new or changing lesions are noted between office visits  Skin cancer screening performed today.  Return in about 6 months (around 12/22/2021) for tbse. IRuthell Rummage, CMA, am acting as scribe for Sarina Ser, MD. Documentation: I have reviewed the above documentation for accuracy and completeness, and I agree with the above.  Sarina Ser,  MD

## 2021-06-24 NOTE — Patient Instructions (Signed)

## 2021-06-26 ENCOUNTER — Other Ambulatory Visit: Payer: Self-pay | Admitting: Dermatology

## 2021-06-26 DIAGNOSIS — Z23 Encounter for immunization: Secondary | ICD-10-CM | POA: Diagnosis not present

## 2021-06-26 DIAGNOSIS — L409 Psoriasis, unspecified: Secondary | ICD-10-CM

## 2021-07-03 DIAGNOSIS — M25572 Pain in left ankle and joints of left foot: Secondary | ICD-10-CM | POA: Diagnosis not present

## 2021-07-12 DIAGNOSIS — R82998 Other abnormal findings in urine: Secondary | ICD-10-CM | POA: Diagnosis not present

## 2021-07-16 DIAGNOSIS — I10 Essential (primary) hypertension: Secondary | ICD-10-CM | POA: Diagnosis not present

## 2021-07-16 DIAGNOSIS — R809 Proteinuria, unspecified: Secondary | ICD-10-CM | POA: Diagnosis not present

## 2021-07-31 ENCOUNTER — Other Ambulatory Visit: Payer: Self-pay | Admitting: Family Medicine

## 2021-07-31 DIAGNOSIS — K219 Gastro-esophageal reflux disease without esophagitis: Secondary | ICD-10-CM

## 2021-07-31 NOTE — Telephone Encounter (Signed)
Requested medications are due for refill today.  yes  Requested medications are on the active medications list.  yes  Last refill. 04/29/2021  Future visit scheduled.   yes  Notes to clinic.  Rx written by Dr. Ky Barban. PCP listed Dr. Roxan Hockey.

## 2021-08-01 DIAGNOSIS — M25572 Pain in left ankle and joints of left foot: Secondary | ICD-10-CM | POA: Diagnosis not present

## 2021-08-01 NOTE — Telephone Encounter (Signed)
Lvm for patient to call and schedule appt for his med refill request.

## 2021-08-13 ENCOUNTER — Telehealth: Payer: Self-pay

## 2021-08-13 DIAGNOSIS — E782 Mixed hyperlipidemia: Secondary | ICD-10-CM

## 2021-08-13 DIAGNOSIS — R9431 Abnormal electrocardiogram [ECG] [EKG]: Secondary | ICD-10-CM

## 2021-08-13 NOTE — Telephone Encounter (Signed)
-----   Message from Livingston Diones, [place] sent at 08/13/2021  9:53 AM EST ----- Regarding: Outside order for calcium score Hi,  I had an outside order for calcium score placed for this patient by Dr. [name] of [facility]. The diagnosis that was attached to the order is Hyperlipidemia and a history of Agatston score > 400. There are no actual codes listed beside the diagnosis. Can you please place this order under Dr. Audie Box and I will have the results forwarded to Dr. Junius Roads? The order has been scanned into Media tab as a radiology order.  Thanks! Nunzio Cobbs

## 2021-09-10 ENCOUNTER — Ambulatory Visit (INDEPENDENT_AMBULATORY_CARE_PROVIDER_SITE_OTHER)
Admission: RE | Admit: 2021-09-10 | Discharge: 2021-09-10 | Disposition: A | Discharge status: Home / Self Care | Payer: Self-pay | Source: Ambulatory Visit | Attending: Cardiovascular Disease | Admitting: Cardiovascular Disease

## 2021-09-10 ENCOUNTER — Other Ambulatory Visit: Payer: Self-pay

## 2021-09-10 DIAGNOSIS — R9431 Abnormal electrocardiogram [ECG] [EKG]: Secondary | ICD-10-CM

## 2021-09-10 DIAGNOSIS — E782 Mixed hyperlipidemia: Secondary | ICD-10-CM

## 2021-10-01 ENCOUNTER — Ambulatory Visit: Payer: Medicare Other

## 2021-10-06 ENCOUNTER — Telehealth: Payer: Medicare Other | Admitting: Emergency Medicine

## 2021-10-06 DIAGNOSIS — U071 COVID-19: Secondary | ICD-10-CM

## 2021-10-06 MED ORDER — BENZONATATE 100 MG PO CAPS: 100.0000 mg | ORAL_CAPSULE | Freq: Two times a day (BID) | ORAL | 0 refills | Status: DC | PRN

## 2021-10-06 NOTE — Progress Notes (Signed)
ADDENDUM:   We will hold off on peridex prescription given alcohol allergy.    E-Visit  for Positive Covid Test Result  We are sorry you are not feeling well. We are here to help!  You have tested positive for COVID-19, meaning that you were infected with the novel coronavirus and could give the virus to others.  It is vitally important that you stay home so you do not spread it to others.      Please continue isolation at home, for at least 10 days since the start of your symptoms and until you have had 24 hours with no fever (without taking a fever reducer) and with improving of symptoms.  If you have no symptoms but tested positive (or all symptoms resolve after 5 days and you have no fever) you can leave your house but continue to wear a mask around others for an additional 5 days. If you have a fever,continue to stay home until you have had 24 hours of no fever. Most cases improve 5-10 days from onset but we have seen a small number of patients who have gotten worse after the 10 days.  Please be sure to watch for worsening symptoms and remain taking the proper precautions.   Go to the nearest hospital ED for assessment if fever/cough/breathlessness are severe or illness seems like a threat to life.    The following symptoms may appear 2-14 days after exposure: Fever Cough Shortness of breath or difficulty breathing Chills Repeated shaking with chills Muscle pain Headache Sore throat New loss of taste or smell Fatigue Congestion or runny nose Nausea or vomiting Diarrhea  You have been enrolled in [place] for COVID-19. Daily you will receive a questionnaire within the [place] [facility] website. Our COVID-19 response team will be monitoring your responses daily.  You can use medication such as prescription cough medication called Tessalon Perles 100 mg. You may take 1-2 capsules every 8 hours as needed for cough  I will also prescribed peridex to help alleviate your sore  throat  You may also take acetaminophen (Tylenol) as needed for fever.  HOME CARE: Only take medications as instructed by your medical team. Drink plenty of fluids and get plenty of rest. A steam or ultrasonic humidifier can help if you have congestion.   GET HELP RIGHT AWAY IF YOU HAVE EMERGENCY WARNING SIGNS.  Call 911 or proceed to your closest emergency facility if: You develop worsening high fever. Trouble breathing Bluish lips or face Persistent pain or pressure in the chest New confusion Inability to wake or stay awake You cough up blood. Your symptoms become more severe Inability to hold down food or fluids  This list is not all possible symptoms. Contact your medical provider for any symptoms that are severe or concerning to you.    Your e-visit answers were reviewed by a board certified advanced clinical practitioner to complete your personal care plan.  Depending on the condition, your plan could have included both over the counter or prescription medications.  If there is a problem please reply once you have received a response from your provider.  Your safety is important to [place].  If you have drug allergies check your prescription carefully.    You can use MyChart to ask questions about today's visit, request a non-urgent call back, or ask for a work or school excuse for 24 hours related to this e-Visit. If it has been greater than 24 hours you will need to follow up  with your provider, or enter a new e-Visit to address those concerns. You will get an e-mail in the next two days asking about your experience.  I hope that your e-visit has been valuable and will speed your recovery. Thank you for using e-visits.

## 2021-10-06 NOTE — Progress Notes (Signed)
I have spent 5 minutes in review of e-visit questionnaire, review and updating patient chart, medical decision making and response to patient.   Dereka Lueras, PA-C    

## 2021-10-31 ENCOUNTER — Ambulatory Visit: Payer: Medicare Other | Admitting: Family Medicine

## 2021-11-07 DIAGNOSIS — Z20822 Contact with and (suspected) exposure to covid-19: Secondary | ICD-10-CM | POA: Diagnosis not present

## 2021-11-08 ENCOUNTER — Other Ambulatory Visit: Payer: Self-pay | Admitting: Family Medicine

## 2021-11-08 DIAGNOSIS — I10 Essential (primary) hypertension: Secondary | ICD-10-CM

## 2021-11-08 DIAGNOSIS — R931 Abnormal findings on diagnostic imaging of heart and coronary circulation: Secondary | ICD-10-CM

## 2021-11-08 DIAGNOSIS — R9430 Abnormal result of cardiovascular function study, unspecified: Secondary | ICD-10-CM

## 2021-11-08 DIAGNOSIS — I158 Other secondary hypertension: Secondary | ICD-10-CM

## 2021-11-09 ENCOUNTER — Other Ambulatory Visit: Payer: Self-pay | Admitting: Family Medicine

## 2021-11-09 DIAGNOSIS — K219 Gastro-esophageal reflux disease without esophagitis: Secondary | ICD-10-CM

## 2021-11-11 ENCOUNTER — Other Ambulatory Visit: Payer: Self-pay | Admitting: Family Medicine

## 2021-11-11 NOTE — Telephone Encounter (Signed)
Changed providers 10/31/21.  Requested Prescriptions  Refused Prescriptions Disp Refills   omeprazole (PRILOSEC) 40 MG capsule [Pharmacy Med Name: OMEPRAZOLE DR 40 MG CAP] 90 capsule 1    Sig: TAKE 1 CAPSULE BY MOUTH DAILY.     Gastroenterology: Proton Pump Inhibitors Passed - 11/09/2021 10:00 AM      Passed - Valid encounter within last 12 months    Recent Outpatient Visits          6 months ago Obstructive sleep apnea syndrome   [place], DO   1 year ago Need for 23-polyvalent pneumococcal polysaccharide vaccine   [place], MD   1 year ago Essential hypertension   [place], MD   1 year ago Essential hypertension   [facility] Tow[place] Malkin, MD   4 years ago Pure hypercholesterolemia   [place], MD      Future Appointments            In 1 month Ralene Bathe, MD [place]

## 2021-11-13 ENCOUNTER — Other Ambulatory Visit: Payer: Self-pay

## 2021-11-13 ENCOUNTER — Ambulatory Visit
Admission: RE | Admit: 2021-11-13 | Discharge: 2021-11-13 | Disposition: A | Discharge status: Home / Self Care | Payer: Medicare Other | Source: Ambulatory Visit | Attending: Family Medicine | Admitting: Family Medicine

## 2021-11-13 DIAGNOSIS — I340 Nonrheumatic mitral (valve) insufficiency: Secondary | ICD-10-CM | POA: Insufficient documentation

## 2021-11-13 DIAGNOSIS — I119 Hypertensive heart disease without heart failure: Secondary | ICD-10-CM | POA: Insufficient documentation

## 2021-11-13 DIAGNOSIS — R931 Abnormal findings on diagnostic imaging of heart and coronary circulation: Secondary | ICD-10-CM | POA: Diagnosis not present

## 2021-11-13 DIAGNOSIS — I10 Essential (primary) hypertension: Secondary | ICD-10-CM | POA: Diagnosis not present

## 2021-11-13 DIAGNOSIS — E785 Hyperlipidemia, unspecified: Secondary | ICD-10-CM | POA: Insufficient documentation

## 2021-11-13 LAB — ECHOCARDIOGRAM COMPLETE
AR max vel: 2.95 cm2
AV Area VTI: 3.29 cm2
AV Area mean vel: 3 cm2
AV Mean grad: 4 mmHg
AV Peak grad: 6.9 mmHg
Ao pk vel: 1.31 m/s
Area-P 1/2: 3.43 cm2
MV VTI: 2.39 cm2
S' Lateral: 2.5 cm

## 2021-11-13 NOTE — Progress Notes (Signed)
*  PRELIMINARY RESULTS* Echocardiogram 2D Echocardiogram has been performed.  Sherrie Sport 11/13/2021, 10:40 AM

## 2021-12-25 ENCOUNTER — Other Ambulatory Visit: Payer: Self-pay | Admitting: Family Medicine

## 2021-12-25 ENCOUNTER — Ambulatory Visit
Admission: RE | Admit: 2021-12-25 | Discharge: 2021-12-25 | Disposition: A | Discharge status: Home / Self Care | Payer: Medicare Other | Source: Home / Self Care | Attending: Family Medicine | Admitting: Family Medicine

## 2021-12-25 ENCOUNTER — Ambulatory Visit
Admission: RE | Admit: 2021-12-25 | Discharge: 2021-12-25 | Disposition: A | Discharge status: Home / Self Care | Payer: Medicare Other | Source: Ambulatory Visit | Attending: Family Medicine | Admitting: Family Medicine

## 2021-12-25 ENCOUNTER — Other Ambulatory Visit: Payer: Self-pay

## 2021-12-25 DIAGNOSIS — R079 Chest pain, unspecified: Secondary | ICD-10-CM

## 2021-12-31 ENCOUNTER — Other Ambulatory Visit: Payer: Self-pay

## 2021-12-31 ENCOUNTER — Ambulatory Visit (INDEPENDENT_AMBULATORY_CARE_PROVIDER_SITE_OTHER): Payer: Medicare Other | Admitting: Dermatology

## 2021-12-31 DIAGNOSIS — L814 Other melanin hyperpigmentation: Secondary | ICD-10-CM

## 2021-12-31 DIAGNOSIS — L821 Other seborrheic keratosis: Secondary | ICD-10-CM | POA: Diagnosis not present

## 2021-12-31 DIAGNOSIS — D1800 Hemangioma unspecified site: Secondary | ICD-10-CM

## 2021-12-31 DIAGNOSIS — L409 Psoriasis, unspecified: Secondary | ICD-10-CM | POA: Diagnosis not present

## 2021-12-31 DIAGNOSIS — Z86018 Personal history of other benign neoplasm: Secondary | ICD-10-CM | POA: Diagnosis not present

## 2021-12-31 DIAGNOSIS — Z1283 Encounter for screening for malignant neoplasm of skin: Secondary | ICD-10-CM

## 2021-12-31 DIAGNOSIS — D229 Melanocytic nevi, unspecified: Secondary | ICD-10-CM | POA: Diagnosis not present

## 2021-12-31 DIAGNOSIS — R21 Rash and other nonspecific skin eruption: Secondary | ICD-10-CM

## 2021-12-31 DIAGNOSIS — L578 Other skin changes due to chronic exposure to nonionizing radiation: Secondary | ICD-10-CM | POA: Diagnosis not present

## 2021-12-31 MED ORDER — VALACYCLOVIR HCL 1 G PO TABS: 1000.0000 mg | ORAL_TABLET | Freq: Three times a day (TID) | ORAL | 0 refills | Status: DC

## 2021-12-31 MED ORDER — MOMETASONE FUROATE 0.1 % EX CREA: TOPICAL_CREAM | CUTANEOUS | 0 refills | Status: DC
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
## 2021-12-31 NOTE — Patient Instructions (Signed)

## 2022-01-02 ENCOUNTER — Encounter: Payer: Self-pay | Admitting: Dermatology

## 2022-01-10 ENCOUNTER — Other Ambulatory Visit: Payer: Self-pay | Admitting: Family Medicine

## 2022-01-10 DIAGNOSIS — R972 Elevated prostate specific antigen [PSA]: Secondary | ICD-10-CM

## 2022-01-27 DIAGNOSIS — Z20822 Contact with and (suspected) exposure to covid-19: Secondary | ICD-10-CM | POA: Diagnosis not present

## 2022-02-10 ENCOUNTER — Ambulatory Visit
Admission: RE | Admit: 2022-02-10 | Discharge: 2022-02-10 | Disposition: A | Discharge status: Home / Self Care | Payer: Medicare Other | Source: Ambulatory Visit | Attending: Family Medicine | Admitting: Family Medicine

## 2022-02-10 DIAGNOSIS — R972 Elevated prostate specific antigen [PSA]: Secondary | ICD-10-CM

## 2022-02-10 MED ORDER — GADOBENATE DIMEGLUMINE 529 MG/ML IV SOLN
15.0000 mL | Freq: Once | INTRAVENOUS | Status: AC | PRN
  Administered 2022-02-10: 15 mL via INTRAVENOUS

## 2022-02-11 DIAGNOSIS — Z20822 Contact with and (suspected) exposure to covid-19: Secondary | ICD-10-CM | POA: Diagnosis not present

## 2022-02-12 ENCOUNTER — Other Ambulatory Visit: Payer: Self-pay | Admitting: General Surgery

## 2022-02-19 ENCOUNTER — Encounter: Payer: Self-pay | Admitting: Urology

## 2022-02-19 ENCOUNTER — Other Ambulatory Visit: Payer: Self-pay

## 2022-02-19 ENCOUNTER — Ambulatory Visit (INDEPENDENT_AMBULATORY_CARE_PROVIDER_SITE_OTHER): Payer: Medicare Other | Admitting: Urology

## 2022-02-19 VITALS — BP 153/71 | HR 59 | Ht 66.0 in | Wt 165.0 lb

## 2022-02-19 DIAGNOSIS — N5203 Combined arterial insufficiency and corporo-venous occlusive erectile dysfunction: Secondary | ICD-10-CM

## 2022-02-19 DIAGNOSIS — R972 Elevated prostate specific antigen [PSA]: Secondary | ICD-10-CM | POA: Diagnosis not present

## 2022-02-19 MED ORDER — TADALAFIL 20 MG PO TABS: 20.0000 mg | ORAL_TABLET | ORAL | 2 refills | Status: DC | PRN

## 2022-02-19 NOTE — Progress Notes (Signed)
02/19/22 3:10 PM   [name] 11-Feb-1951 [id number]  Referring provider:  [name], MD [place],  [place] [address] Chief Complaint  Patient presents with   Elevated PSA    HPI: Christian Santos is a 71 y.o.male for further evaluation of elevated PSA and recent MRI.   His most recent PSA was 4.6 on 01/07/2022.He had a previous PSA on 11/08/2021 that was 5.8.   He underwent a prostate MRI to further evaluate elevated PSA. It visualized PIRADS category 5 lesion in the anterior mid gland transitional zone with extracapsular extension.PIRADS category 3 lesion in the LEFT paramidline mid gland transitional zone. No evidence of seminal vesicle involvement, nodal disease or bone metastasis. 21.8 g   He does not have a family history of prostate cancer.   He is accompanied by his wife. He reports that his PCP is in [place].He takes generic Viagra for his ED h has not improvement on this medication.    No urinary issues.     PSA trend:  04/2016 1.48 04/2017  2.3 04/2018 3.46 04/2019 2.54 11/08/2021 5.8 01/07/2022  4.6   PMH: Past Medical History:  Diagnosis Date   Actinic keratosis 05/28/2020   L temple    Allergy 2017   Arthritis 2017   Basal cell carcinoma 05/28/2020   R temple/hairline - ED&C    Dysplastic nevus 02/20/2014   Left posterior shoulder. Moderate atypia, limited margins free.   GERD (gastroesophageal reflux disease)    Hyperlipidemia    Hypertension    Insomnia    Polycythemia vera ([place])    Psoriasis    Sleep apnea 2020    Surgical History: Past Surgical History:  Procedure Laterality Date   HAND SURGERY      Home Medications:  Allergies as of 02/19/2022       Reactions   Alcohol Other (See Comments)   Erythromycin Base    Lisinopril Other (See Comments)   cough   Other Other (See Comments)   Mycins = chest pain, Palm of [place] = skin test results.   Wool Alcohol  [lanolin] Other (See Comments)    Clobetasol Rash   Severe blisters        Medication List        Accurate as of Feb 19, 2022  3:10 PM. If you have any questions, ask your nurse or doctor.          STOP taking these medications    acetaminophen 650 MG CR tablet Commonly known as: TYLENOL Stopped by: Hollice Espy, MD   benzonatate 100 MG capsule Commonly known as: TESSALON Stopped by: Hollice Espy, MD   diphenhydrAMINE 25 mg capsule Commonly known as: BENADRYL Stopped by: Hollice Espy, MD   tacrolimus 0.1 % ointment Commonly known as: Protopic Stopped by: Hollice Espy, MD   valACYclovir 1000 MG tablet Commonly known as: VALTREX Stopped by: Hollice Espy, MD       TAKE these medications    amLODipine 5 MG tablet Commonly known as: NORVASC Take 1 tablet (5 mg total) by mouth daily.   ezetimibe 10 MG tablet Commonly known as: Zetia Take 1 tablet (10 mg total) by mouth daily.   fluticasone 50 MCG/ACT nasal spray Commonly known as: FLONASE Place 2 sprays into both nostrils daily.   Magnesium 500 MG Caps Take by mouth.   melatonin 5 MG Tabs Take 5 mg by mouth.   metoprolol succinate 50 MG 24 hr tablet Commonly known as:  TOPROL-XL Take 1 tablet by mouth daily.   mometasone 0.1 % cream Commonly known as: ELOCON Apply to aa rash on forehead QD-BID PRN.   multivitamin with minerals tablet Take 1 tablet by mouth daily.   omeprazole 40 MG capsule Commonly known as: PRILOSEC Take 1 capsule (40 mg total) by mouth daily.   pimecrolimus 1 % cream Commonly known as: ELIDEL APPLY TO AFFECTED AREAS OF PSORIASIS ONCE OR TWICE DAILY AS NEEDED   PROBIOTIC ADVANCED PO Take by mouth.   sildenafil 50 MG tablet Commonly known as: Viagra Take 1 tablet (50 mg total) by mouth daily as needed for erectile dysfunction.   traMADol 50 MG tablet Commonly known as: ULTRAM 1/2-1 po bid prn   UNABLE TO FIND Take by mouth. equilac 1200 mg for psoriasis        Allergies:  Allergies   Allergen Reactions   Alcohol Other (See Comments)   Erythromycin Base    Lisinopril Other (See Comments)    cough   Other Other (See Comments)    Mycins = chest pain, [place] = skin test results.    Wool Alcohol  [Lanolin] Other (See Comments)   Clobetasol Rash    Severe blisters     Family History: Family History  Problem Relation Age of Onset   Cancer Mother    Hearing loss Mother    Liver cancer Mother    Lung cancer Mother    Colon cancer Mother    Heart attack Father    Cancer Father    Heart disease Father    Hypertension Father    Bladder Cancer Father    Pancreatic cancer Father    Heart attack Brother    Heart disease Brother    Arthritis Maternal Grandmother    ADD / ADHD Daughter    Asthma Daughter    Anxiety disorder Daughter    Asthma Daughter     Social History:  reports that he has never smoked. He has never used smokeless tobacco. He reports current alcohol use. He reports that he does not use drugs.   Physical Exam: BP (!) 153/71   Pulse (!) 59   Ht '5\' 6"'$  (1.676 m)   Wt 165 lb (74.8 kg)   BMI 26.63 kg/m   Constitutional:  Alert and oriented, No acute distress. HEENT: [place] AT, moist mucus membranes.  Trachea midline, no masses. Cardiovascular: No clubbing, cyanosis, or edema. Respiratory: Normal respiratory effort, no increased work of breathing. Rectal: Normal sphincter tone.  Small prostate, no nodules.   Skin: No rashes, bruises or suspicious lesions. Neurologic: Grossly intact, no focal deficits, moving all 4 extremities. Psychiatric: Normal mood and affect.  Laboratory Data:  Lab Results  Component Value Date   CREATININE 0.85 04/29/2021   Lab Results  Component Value Date   HGBA1C 5.5 03/06/2016    Urinalysis Pending   Pertinent Imaging: CLINICAL DATA:  A 71 year old male presents for evaluation of elevated PSA to 4.6. No prior biopsy is reported.   EXAM: MR PROSTATE WITHOUT AND WITH CONTRAST    TECHNIQUE: Multiplanar multisequence MRI images were obtained of the pelvis centered about the prostate. Pre and post contrast images were obtained.   CONTRAST:  83m MULTIHANCE GADOBENATE DIMEGLUMINE 529 MG/ML IV SOLN   COMPARISON:  None   FINDINGS: Prostate:   Transitional zone: In the anterior mid gland transitional zone is an area of focal T2 hypointensity with indistinct margins and signs of restricted diffusion measuring 15 mm. This  tracks towards the prostate base anteriorly. And is just to the RIGHT of midline. (Image 12/8) PIRADS category 5   Nodular changes of BPH elsewhere in the transitional zone with additional area of asymmetry in the LEFT paramidline mid gland transitional zone (image 29/11) approximately 11 mm with equivocal diffusion, PIRADS category 3   Peripheral zone: No sign of high-risk lesion in the peripheral zone.   Volume: 21.8 cc   Transcapsular spread: Along the RIGHT paramidline anterior transitional zone and fibromuscular stroma is an area of bulging with extracapsular extension (image 26/11)   Seminal vesicle involvement: Absent   Neurovascular bundle involvement: Absent   Pelvic adenopathy: Absent   Bone metastasis: Absent   Other findings: Diverticular disease of the sigmoid.   IMPRESSION: 1. PIRADS category 5 lesion in the anterior mid gland transitional zone with extracapsular extension. 2. PIRADS category 3 lesion in the LEFT paramidline mid gland transitional zone. 3. No evidence of seminal vesicle involvement, nodal disease or bone metastasis.     Electronically Signed   By: Zetta Bills M.D.   On: 02/12/2022 10:01   I have personally reviewed the images and agree with radiologist interpretation.    Assessment & Plan:   Elevated PSA  -  We reviewed the implications of an elevated PSA and the uncertainty surrounding it. In general, a man's PSA increases with age and is produced by both normal and cancerous prostate  tissue. Differential for elevated PSA is BPH, prostate cancer, infection, recent intercourse/ejaculation, prostate infarction, recent urethroscopic manipulation (foley placement/cystoscopy) and prostatitis. Management of an elevated PSA can include observation or prostate biopsy and wediscussed this in detail. We discussed that indications for prostate biopsy are defined by age and race specific PSA cutoffs as well as a PSA velocity of 0.75/year. - Overall trend is fluctuating but trending upwards. We discussed a cognitive fusion biopsy versus a standard fusion biopsy.  - Referral sent to [facility] for fusion biopsy.  -We discussed prostate biopsy in detail including the procedure itself, the risks of blood in the urine, stool, and ejaculate, serious infection, and discomfort. He is willing to proceed with this as discussed. -f/u here after biopsy to discuss results  2. Erectile dysfunction  - Cialis; prescribed to try as alternative to generic viagra given lack of desired efficacy  F/u prostate biopsy results  I,Kailey Littlejohn,acting as a scribe for Hollice Espy, MD.,have documented all relevant documentation on the behalf of Hollice Espy, MD,as directed by  Hollice Espy, MD while in the presence of Hollice Espy, MD.  I have reviewed the above documentation for accuracy and completeness, and I agree with the above.   Hollice Espy, MD   [facility] [address], [place], [place] [address] [phone]

## 2022-02-20 LAB — URINALYSIS, COMPLETE
Bilirubin, UA: NEGATIVE
Glucose, UA: NEGATIVE
Ketones, UA: NEGATIVE
Leukocytes,UA: NEGATIVE
Nitrite, UA: NEGATIVE
Protein,UA: NEGATIVE
RBC, UA: NEGATIVE
Specific Gravity, UA: 1.01 (ref 1.005–1.030)
Urobilinogen, Ur: 0.2 mg/dL (ref 0.2–1.0)
pH, UA: 6 (ref 5.0–7.5)

## 2022-02-20 LAB — MICROSCOPIC EXAMINATION: Bacteria, UA: NONE SEEN

## 2022-03-04 ENCOUNTER — Encounter: Payer: Self-pay | Admitting: Urology

## 2022-03-06 DIAGNOSIS — C61 Malignant neoplasm of prostate: Secondary | ICD-10-CM

## 2022-03-06 HISTORY — DX: Malignant neoplasm of prostate: C61

## 2022-03-11 ENCOUNTER — Other Ambulatory Visit: Payer: Self-pay | Admitting: Family Medicine

## 2022-03-11 DIAGNOSIS — I10 Essential (primary) hypertension: Secondary | ICD-10-CM

## 2022-03-18 DIAGNOSIS — C61 Malignant neoplasm of prostate: Secondary | ICD-10-CM | POA: Diagnosis not present

## 2022-03-18 DIAGNOSIS — R972 Elevated prostate specific antigen [PSA]: Secondary | ICD-10-CM | POA: Diagnosis not present

## 2022-03-25 ENCOUNTER — Ambulatory Visit (INDEPENDENT_AMBULATORY_CARE_PROVIDER_SITE_OTHER): Payer: Medicare Other | Admitting: Urology

## 2022-03-25 ENCOUNTER — Other Ambulatory Visit: Payer: Self-pay | Admitting: Urology

## 2022-03-25 VITALS — BP 165/78 | HR 54 | Ht 66.0 in | Wt 160.0 lb

## 2022-03-25 DIAGNOSIS — C61 Malignant neoplasm of prostate: Secondary | ICD-10-CM | POA: Diagnosis not present

## 2022-03-25 DIAGNOSIS — R972 Elevated prostate specific antigen [PSA]: Secondary | ICD-10-CM | POA: Diagnosis not present

## 2022-03-25 NOTE — Progress Notes (Signed)
03/26/22 4:50 PM   Magaw Landry Sherral Hammers Dec 17, 1950 [id number]  Referring provider:  [name], MD [address] [place],  [place] [address] Chief Complaint  Patient presents with   Results     HPI: Christian Santos is a 71 y.o.male with a personal history of elevated PSA and ED who presents today for fusion biopsy results.   His most recent PSA was 4.6 on 01/07/2022.He had a previous PSA on 11/08/2021 that was 5.8.    He underwent a prostate MRI to further evaluate elevated PSA. It visualized PIRADS category 5 lesion in the anterior mid gland transitional zone with extracapsular extension.PIRADS category 3 lesion in the LEFT paramidline mid gland transitional zone. No evidence of seminal vesicle involvement, nodal disease or bone metastasis. 21.8 g    He does not have a family history of prostate cancer.   He underwent a fusion biopsy on 03/18/2022. Surgical pathology was consistent with Gleason 3+3 in prostate ROI 2, and Gleason 3+3 involving 1 core affecting up to 50% of tissue in left apex.   He is doing well today.    IPSS     Row Name 03/25/22 1600         International Prostate Symptom Score   How often have you had the sensation of not emptying your bladder? Not at All     How often have you had to urinate less than every two hours? Not at All     How often have you found you stopped and started again several times when you urinated? Not at All     How often have you found it difficult to postpone urination? Less than 1 in 5 times     How often have you had a weak urinary stream? Not at All     How often have you had to strain to start urination? Not at All     How many times did you typically get up at night to urinate? 1 Time     Total IPSS Score 2       Quality of Life due to urinary symptoms   If you were to spend the rest of your life with your urinary condition just the way it is now how would you feel about that? Delighted               Score:  1-7 Mild 8-19 Moderate 20-35 Severe   SHIM     Row Name 03/25/22 1623         SHIM: Over the last 6 months:   How do you rate your confidence that you could get and keep an erection? Low     When you had erections with sexual stimulation, how often were your erections hard enough for penetration (entering your partner)? A Few Times (much less than half the time)     During sexual intercourse, how often were you able to maintain your erection after you had penetrated (entered) your partner? Almost Never or Never     During sexual intercourse, how difficult was it to maintain your erection to completion of intercourse? Extremely Difficult     When you attempted sexual intercourse, how often was it satisfactory for you? A Few Times (much less than half the time)       SHIM Total Score   SHIM 8               PMH: Past Medical History:  Diagnosis Date   Actinic  keratosis 05/28/2020   L temple    Allergy 2017   Arthritis 2017   Basal cell carcinoma 05/28/2020   R temple/hairline - ED&C    Dysplastic nevus 02/20/2014   Left posterior shoulder. Moderate atypia, limited margins free.   GERD (gastroesophageal reflux disease)    Hyperlipidemia    Hypertension    Insomnia    Polycythemia vera ([place])    Psoriasis    Sleep apnea 2020    Surgical History: Past Surgical History:  Procedure Laterality Date   HAND SURGERY      Home Medications:  Allergies as of 03/25/2022       Reactions   Alcohol Other (See Comments)   Erythromycin Base    Lisinopril Other (See Comments)   cough   Other Other (See Comments)   Mycins = chest pain, [place] of [place] = skin test results.   Wool Alcohol  [lanolin] Other (See Comments)   Clobetasol Rash   Severe blisters        Medication List        Accurate as of March 25, 2022 11:59 PM. If you have any questions, ask your nurse or doctor.          STOP taking these medications    traZODone 50 MG tablet Commonly  known as: DESYREL Stopped by: Christian Espy, MD       TAKE these medications    amLODipine 5 MG tablet Commonly known as: NORVASC Take 1 tablet (5 mg total) by mouth daily.   ezetimibe 10 MG tablet Commonly known as: Zetia Take 1 tablet (10 mg total) by mouth daily.   fluticasone 50 MCG/ACT nasal spray Commonly known as: FLONASE Place 2 sprays into both nostrils daily.   Magnesium 500 MG Caps Take by mouth.   melatonin 5 MG Tabs Take 5 mg by mouth.   metoprolol succinate 50 MG 24 hr tablet Commonly known as: TOPROL-XL Take 1 tablet by mouth daily.   mometasone 0.1 % cream Commonly known as: ELOCON Apply to aa rash on forehead QD-BID PRN.   multivitamin with minerals tablet Take 1 tablet by mouth daily.   omeprazole 40 MG capsule Commonly known as: PRILOSEC Take 1 capsule (40 mg total) by mouth daily.   pimecrolimus 1 % cream Commonly known as: ELIDEL APPLY TO AFFECTED AREAS OF PSORIASIS ONCE OR TWICE DAILY AS NEEDED   PROBIOTIC ADVANCED PO Take by mouth.   sildenafil 50 MG tablet Commonly known as: Viagra Take 1 tablet (50 mg total) by mouth daily as needed for erectile dysfunction.   tadalafil 20 MG tablet Commonly known as: CIALIS Take 1 tablet (20 mg total) by mouth as needed for erectile dysfunction. As needed 2 hours prior to intercourse   traMADol 50 MG tablet Commonly known as: ULTRAM 1/2-1 po bid prn   UNABLE TO FIND Take by mouth. equilac 1200 mg for psoriasis        Allergies:  Allergies  Allergen Reactions   Alcohol Other (See Comments)   Erythromycin Base    Lisinopril Other (See Comments)    cough   Other Other (See Comments)    Mycins = chest pain, [place] of [place] = skin test results.    Wool Alcohol  [Lanolin] Other (See Comments)   Clobetasol Rash    Severe blisters     Family History: Family History  Problem Relation Age of Onset   Cancer Mother    Hearing loss Mother    Liver cancer Mother  Lung cancer Mother     Colon cancer Mother    Heart attack Father    Cancer Father    Heart disease Father    Hypertension Father    Bladder Cancer Father    Pancreatic cancer Father    Heart attack Brother    Heart disease Brother    Arthritis Maternal Grandmother    ADD / ADHD Daughter    Asthma Daughter    Anxiety disorder Daughter    Asthma Daughter     Social History:  reports that he has never smoked. He has never used smokeless tobacco. He reports current alcohol use. He reports that he does not use drugs.   Physical Exam: BP (!) 165/78   Pulse (!) 54   Ht '5\' 6"'$  (1.676 m)   Wt 160 lb (72.6 kg)   BMI 25.82 kg/m   Constitutional:  Alert and oriented, No acute distress. HEENT: [place] AT, moist mucus membranes.  Trachea midline, no masses. Cardiovascular: No clubbing, cyanosis, or edema. Respiratory: Normal respiratory effort, no increased work of breathing. Skin: No rashes, bruises or suspicious lesions. Neurologic: Grossly intact, no focal deficits, moving all 4 extremities. Psychiatric: Normal mood and affect.  Laboratory Data:  Lab Results  Component Value Date   CREATININE 0.85 04/29/2021   Lab Results  Component Value Date   HGBA1C 5.5 03/06/2016     Assessment & Plan:   Prostate cancer  -Very low risk prostate cancer  - The patient was counseled about the natural history of prostate cancer and the standard treatment options that are available for prostate cancer. It was explained to him how his age and life expectancy, clinical stage, Gleason score, and PSA affect his prognosis, the decision to proceed with additional staging studies, as well as how that information influences recommended treatment strategies. We discussed the roles for active surveillance, radiation therapy, surgical therapy, androgen deprivation, as well as ablative therapy options for the treatment of prostate cancer as appropriate to his individual cancer situation. We discussed the risks and benefits of these  options with regard to their impact on cancer control and also in terms of potential adverse events, complications, and impact on quality of life particularly related to urinary, bowel, and sexual function. The patient was encouraged to ask questions throughout the discussion today and all questions were answered to his stated satisfaction. In addition, the patient was provided with and/or directed to appropriate resources and literature for further education about prostate cancer treatment options. -We discussed various active surveillance protocols today - Recommend active surveillance with PSA 6 months -We will consider possibility of repeat biopsy versus prostate MRI at 1 year interval pending his PSA trend.  He is agreeable this plan.  Return in 6 months for labs (PSA) and 1 year for DRE and PSA   Conley Rolls as a scribe for Christian Espy, MD.,have documented all relevant documentation on the behalf of Christian Espy, MD,as directed by  Christian Espy, MD while in the presence of Christian Santos, [place] [address] [place], [place] [address] [phone]

## 2022-04-01 DIAGNOSIS — H1132 Conjunctival hemorrhage, left eye: Secondary | ICD-10-CM | POA: Diagnosis not present

## 2022-04-14 DIAGNOSIS — C61 Malignant neoplasm of prostate: Secondary | ICD-10-CM | POA: Diagnosis not present

## 2022-04-15 ENCOUNTER — Telehealth: Payer: Self-pay | Admitting: Radiation Oncology

## 2022-04-15 NOTE — Telephone Encounter (Signed)
LVM to scheduled consult with Dr. Tammi Klippel

## 2022-04-21 NOTE — Progress Notes (Signed)
Radiation Oncology         [phone] ________________________________  Initial Outpatient Consultation  Name: Christian Santos MRN: [id number]  Date: 04/22/2022  DOB: 1951/09/29  Christian Santos, Christian Como, MD  Christian Frock, MD   REFERRING PHYSICIAN: Alexis Frock, MD  DIAGNOSIS: 71 y.o. gentleman with Stage T1c adenocarcinoma of the prostate with Gleason score of 3+3, and PSA of 4.6.  No diagnosis found.  HISTORY OF PRESENT ILLNESS: Christian Santos is a 71 y.o. male with a diagnosis of prostate cancer. He was noted to have an elevated PSA of 5.8 in 11/2021 by his primary care physician, Dr. Junius Santos. Repeat PSA in 01/2022 showed slight decrease to 4.6. He underwent prostate MRI on 02/10/22 showing: PI-RADS 5 lesion in anterior mid gland transitional zone with extracapsular extension; PI-RADS 3 lesion in left paramidline mid gland transitional zone; no evidence of seminal vesicle involvement, nodal disease, or bone metastasis. Accordingly, he was referred for evaluation in urology by Dr. Erlene Santos on 02/19/22,  digital rectal examination was performed at that time revealing no nodules. The patient was referred to Dr. Tresa Santos for MRI fusion biopsy of the prostate on 03/18/22.  The prostate volume measured 24.75 cc.  Out of 16 core biopsies, 3 were positive.  The maximum Gleason score was 3+3, and this was seen in both samples from ROI #2 and left apex.  The patient reviewed the biopsy results with his urologist and he has kindly been referred today for discussion of potential radiation treatment options.   PREVIOUS RADIATION THERAPY: No  PAST MEDICAL HISTORY:  Past Medical History:  Diagnosis Date   Actinic keratosis 05/28/2020   L temple    Allergy 2017   Arthritis 2017   Basal cell carcinoma 05/28/2020   R temple/hairline - ED&C    Dysplastic nevus 02/20/2014   Left posterior shoulder. Moderate atypia, limited margins free.   GERD (gastroesophageal reflux disease)    Hyperlipidemia     Hypertension    Insomnia    Polycythemia vera ([place])    Psoriasis    Sleep apnea 2020      PAST SURGICAL HISTORY: Past Surgical History:  Procedure Laterality Date   HAND SURGERY      FAMILY HISTORY:  Family History  Problem Relation Age of Onset   Cancer Mother    Hearing loss Mother    Liver cancer Mother    Lung cancer Mother    Colon cancer Mother    Heart attack Father    Cancer Father    Heart disease Father    Hypertension Father    Bladder Cancer Father    Pancreatic cancer Father    Heart attack Brother    Heart disease Brother    Arthritis Maternal Grandmother    ADD / ADHD Daughter    Asthma Daughter    Anxiety disorder Daughter    Asthma Daughter     SOCIAL HISTORY:  Social History   Socioeconomic History   Marital status: Married    Spouse name: Not on file   Number of children: 3   Years of education: Not on file   Highest education level: Not on file  Occupational History   Not on file  Tobacco Use   Smoking status: Never   Smokeless tobacco: Never  Vaping Use   Vaping Use: Never used  Substance and Sexual Activity   Alcohol use: Yes    Alcohol/week: 0.0 standard drinks of alcohol   Drug use: No   Sexual activity:  Yes    Partners: Female    Birth control/protection: None  Other Topics Concern   Not on file  Social History Narrative   Not on file   Social Determinants of Health   Financial Resource Strain: Low Risk  (09/20/2020)   Overall Financial Resource Strain (CARDIA)    Difficulty of Paying Living Expenses: Not hard at all  Food Insecurity: No Food Insecurity (09/20/2020)   Hunger Vital Sign    Worried About Running Out of Food in the Last Year: Never true    Ran Out of Food in the Last Year: Never true  Transportation Needs: No Transportation Needs (09/20/2020)   PRAPARE - Hydrologist (Medical): No    Lack of Transportation (Non-Medical): No  Physical Activity: Sufficiently Active  (09/20/2020)   Exercise Vital Sign    Days of Exercise per Week: 7 days    Minutes of Exercise per Session: 60 min  Stress: No Stress Concern Present (09/20/2020)   [place]    Feeling of Stress : Not at all  Social Connections: Moderately Integrated (09/20/2020)   Social Connection and Isolation Panel [NHANES]    Frequency of Communication with Friends and Family: More than three times a week    Frequency of Social Gatherings with Friends and Family: Three times a week    Attends Religious Services: More than 4 times per year    Active Member of Clubs or Organizations: No    Attends Archivist Meetings: Never    Marital Status: Married  Human resources officer Violence: Not At Risk (09/20/2020)   Humiliation, Afraid, Rape, and Kick questionnaire    Fear of Current or Ex-Partner: No    Emotionally Abused: No    Physically Abused: No    Sexually Abused: No    ALLERGIES: Alcohol, Erythromycin base, Lisinopril, Other, Wool alcohol  [lanolin], and Clobetasol  MEDICATIONS:  Current Outpatient Medications  Medication Sig Dispense Refill   amLODipine (NORVASC) 5 MG tablet Take 1 tablet (5 mg total) by mouth daily. 90 tablet 3   ezetimibe (ZETIA) 10 MG tablet Take 1 tablet (10 mg total) by mouth daily. 90 tablet 3   fluticasone (FLONASE) 50 MCG/ACT nasal spray Place 2 sprays into both nostrils daily. 16 g 6   Magnesium 500 MG CAPS Take by mouth.     melatonin 5 MG TABS Take 5 mg by mouth.     metoprolol succinate (TOPROL-XL) 50 MG 24 hr tablet Take 1 tablet by mouth daily.     mometasone (ELOCON) 0.1 % cream Apply to aa rash on forehead QD-BID PRN. 45 g 0   Multiple Vitamins-Minerals (MULTIVITAMIN WITH MINERALS) tablet Take 1 tablet by mouth daily.     omeprazole (PRILOSEC) 40 MG capsule Take 1 capsule (40 mg total) by mouth daily. 90 capsule 1   pimecrolimus (ELIDEL) 1 % cream APPLY TO AFFECTED AREAS OF PSORIASIS  ONCE OR TWICE DAILY AS NEEDED 60 g 2   Probiotic Product (PROBIOTIC ADVANCED PO) Take by mouth.     sildenafil (VIAGRA) 50 MG tablet Take 1 tablet (50 mg total) by mouth daily as needed for erectile dysfunction. 10 tablet 2   tadalafil (CIALIS) 20 MG tablet Take 1 tablet (20 mg total) by mouth as needed for erectile dysfunction. As needed 2 hours prior to intercourse 20 tablet 2   traMADol (ULTRAM) 50 MG tablet 1/2-1 po bid prn     UNABLE TO  FIND Take by mouth. equilac 1200 mg for psoriasis     No current facility-administered medications for this encounter.    REVIEW OF SYSTEMS:  On review of systems, the patient reports that he is doing well overall. He denies any chest pain, shortness of breath, cough, fevers, chills, night sweats, unintended weight changes. He denies any bowel disturbances, and denies abdominal pain, nausea or vomiting. He denies any new musculoskeletal or joint aches or pains. His IPSS was ***, indicating *** urinary symptoms. His SHIM was ***, indicating he {does not have/has mild/moderate/severe} erectile dysfunction. A complete review of systems is obtained and is otherwise negative.    PHYSICAL EXAM:  Wt Readings from Last 3 Encounters:  03/25/22 160 lb (72.6 kg)  02/19/22 165 lb (74.8 kg)  04/29/21 179 lb 12.8 oz (81.6 kg)   Temp Readings from Last 3 Encounters:  06/06/21 98.8 F (37.1 C) (Oral)  04/29/21 98.1 F (36.7 C) (Oral)  10/08/20 98.2 F (36.8 C) (Oral)   BP Readings from Last 3 Encounters:  03/25/22 (!) 165/78  02/19/22 (!) 153/71  06/06/21 (!) 167/91   Pulse Readings from Last 3 Encounters:  03/25/22 (!) 54  02/19/22 (!) 59  06/06/21 79    /10  In general this is a well appearing *** male in no acute distress. He's alert and oriented x4 and appropriate throughout the examination. Cardiopulmonary assessment is negative for acute distress, and he exhibits normal effort.     KPS = ***  100 - Normal; no complaints; no evidence of  disease. 90   - Able to carry on normal activity; minor signs or symptoms of disease. 80   - Normal activity with effort; some signs or symptoms of disease. 67   - Cares for self; unable to carry on normal activity or to do active work. 60   - Requires occasional assistance, but is able to care for most of his personal needs. 50   - Requires considerable assistance and frequent medical care. 82   - Disabled; requires special care and assistance. 15   - Severely disabled; hospital admission is indicated although death not imminent. 83   - Very sick; hospital admission necessary; active supportive treatment necessary. 10   - Moribund; fatal processes progressing rapidly. 0     - Dead  Karnofsky DA, Abelmann Buford, Craver LS and Burchenal [facility] [phone]) The use of the nitrogen mustards in the palliative treatment of carcinoma: with particular reference to bronchogenic carcinoma Cancer 1 [phone]  LABORATORY DATA:  Lab Results  Component Value Date   WBC 6.4 06/02/2017   HGB 15.8 06/02/2017   HCT 44.8 06/02/2017   MCV 91.4 06/02/2017   PLT 294 06/02/2017   Lab Results  Component Value Date   NA 140 04/29/2021   K 4.6 04/29/2021   CL 106 04/29/2021   CO2 27 04/29/2021   Lab Results  Component Value Date   ALT 26 06/02/2017   AST 22 06/02/2017   GGT 15 02/02/2015   ALKPHOS 86 06/02/2017   BILITOT 0.6 06/02/2017     RADIOGRAPHY: No results found.    IMPRESSION/PLAN: 1. 71 y.o. gentleman with Stage T1c adenocarcinoma of the prostate with Gleason Score of 3+3, and PSA of 4.6. We discussed the patient's workup and outlined the nature of prostate cancer in this setting. The patient's T stage, Gleason's score, and PSA put him into the low risk group. Accordingly, he is eligible for a variety of potential treatment options including active  surveillance, brachytherapy, 5.5 weeks of external radiation, or prostatectomy. We discussed the available radiation techniques, and focused on the details and  logistics of delivery. We discussed and outlined the risks, benefits, short and long-term effects associated with radiotherapy and compared and contrasted these with prostatectomy. We discussed the role of SpaceOAR gel in reducing the rectal toxicity associated with radiotherapy. He appears to have a good understanding of his disease and our treatment recommendations which are of curative intent.  He was encouraged to ask questions that were answered to his stated satisfaction.  At the conclusion of our conversation, the patient is interested in moving forward with ***.  We personally spent *** minutes in this encounter including chart review, reviewing radiological studies, meeting face-to-face with the patient, entering orders and completing documentation.    Nicholos Johns, PA-C    Tyler Pita, MD  [facility] Direct Dial: [phone]  Fax: [phone] [email or web address]  Skype  LinkedIn   This document serves as a record of services personally performed by Tyler Pita, MD and Freeman Caldron, PA-C. It was created on their behalf by Wilburn Mylar, a trained medical scribe. The creation of this record is based on the scribe's personal observations and the provider's statements to them. This document has been checked and approved by the attending provider.

## 2022-04-21 NOTE — Progress Notes (Signed)
GU Location of Tumor / Histology: Prostate Ca  If Prostate Cancer, Gleason Score is (3 + 3) and PSA is (4.6 as of 01/07/2022)  Biopsies       Past/Anticipated interventions by urology, if any:     Past/Anticipated interventions by medical oncology, if any: NA  Weight changes, if any: No  IPPS:  3 SHIM:  5  Bowel/Bladder complaints, if any:  No  Nausea/Vomiting, if any: No  Pain issues, if any:  0/10  SAFETY ISSUES: Prior radiation? No Pacemaker/ICD? No Possible current pregnancy? Male Is the patient on methotrexate? No  Current Complaints / other details:

## 2022-04-22 ENCOUNTER — Ambulatory Visit
Admission: RE | Admit: 2022-04-22 | Discharge: 2022-04-22 | Disposition: A | Discharge status: Home / Self Care | Payer: Medicare Other | Source: Ambulatory Visit | Attending: Radiation Oncology | Admitting: Radiation Oncology

## 2022-04-22 ENCOUNTER — Other Ambulatory Visit: Payer: Self-pay

## 2022-04-22 VITALS — BP 146/72 | HR 55 | Temp 97.8°F | Resp 20 | Wt 173.0 lb

## 2022-04-22 DIAGNOSIS — G4730 Sleep apnea, unspecified: Secondary | ICD-10-CM | POA: Diagnosis not present

## 2022-04-22 DIAGNOSIS — Z800 Family history of malignant neoplasm of digestive organs: Secondary | ICD-10-CM | POA: Diagnosis not present

## 2022-04-22 DIAGNOSIS — Z8052 Family history of malignant neoplasm of bladder: Secondary | ICD-10-CM | POA: Diagnosis not present

## 2022-04-22 DIAGNOSIS — Z79899 Other long term (current) drug therapy: Secondary | ICD-10-CM | POA: Insufficient documentation

## 2022-04-22 DIAGNOSIS — E785 Hyperlipidemia, unspecified: Secondary | ICD-10-CM | POA: Insufficient documentation

## 2022-04-22 DIAGNOSIS — K219 Gastro-esophageal reflux disease without esophagitis: Secondary | ICD-10-CM | POA: Insufficient documentation

## 2022-04-22 DIAGNOSIS — D45 Polycythemia vera: Secondary | ICD-10-CM | POA: Insufficient documentation

## 2022-04-22 DIAGNOSIS — I10 Essential (primary) hypertension: Secondary | ICD-10-CM | POA: Insufficient documentation

## 2022-04-22 DIAGNOSIS — Z86018 Personal history of other benign neoplasm: Secondary | ICD-10-CM | POA: Diagnosis not present

## 2022-04-22 DIAGNOSIS — L570 Actinic keratosis: Secondary | ICD-10-CM | POA: Insufficient documentation

## 2022-04-22 DIAGNOSIS — Z801 Family history of malignant neoplasm of trachea, bronchus and lung: Secondary | ICD-10-CM | POA: Diagnosis not present

## 2022-04-22 DIAGNOSIS — C61 Malignant neoplasm of prostate: Secondary | ICD-10-CM | POA: Insufficient documentation

## 2022-04-22 DIAGNOSIS — Z85828 Personal history of other malignant neoplasm of skin: Secondary | ICD-10-CM | POA: Insufficient documentation

## 2022-04-22 DIAGNOSIS — Z191 Hormone sensitive malignancy status: Secondary | ICD-10-CM | POA: Diagnosis not present

## 2022-04-22 NOTE — Progress Notes (Signed)
Introduced myself to the patient, and his wife [name], as the prostate nurse navigator.  No barriers to care identified at this time.  He is here to discuss his radiation treatment options, very interested in brachytherapy.  I gave him my business card and asked him to call me with questions or concerns.  Verbalized understanding.

## 2022-04-25 ENCOUNTER — Telehealth: Payer: Self-pay | Admitting: *Deleted

## 2022-04-25 NOTE — Telephone Encounter (Signed)
CALLED PATIENT TO INFORM OF PRE-SEED APPTS. FOR 06-12-22, SPOKE WITH PATIENT AND HE IS AWARE OF THESE APPTS.

## 2022-05-08 ENCOUNTER — Other Ambulatory Visit: Payer: Self-pay | Admitting: Urology

## 2022-05-09 NOTE — Progress Notes (Signed)
Connected with patient's spouse, [name], to ensure they are aware of upcoming dates for treatment.  RN provided education on CT Sim and what to expect with brachytherapy.  Information sheet mailed to patient.  Will continue to follow to ensure navigation needs are met.

## 2022-05-20 ENCOUNTER — Other Ambulatory Visit: Payer: Self-pay | Admitting: General Surgery

## 2022-05-20 DIAGNOSIS — Z86010 Personal history of colonic polyps: Secondary | ICD-10-CM | POA: Diagnosis not present

## 2022-05-20 DIAGNOSIS — Z800 Family history of malignant neoplasm of digestive organs: Secondary | ICD-10-CM | POA: Diagnosis not present

## 2022-05-20 NOTE — Progress Notes (Signed)
Progress Notes - documented in this encounter Christian Santos, Geronimo Boot, MD - 05/20/2022 9:00 AM EDT Formatting of this note is different from the original. Subjective:   Patient ID: Christian Santos is a 71 y.o. male.  HPI  The following portions of the patient's history were reviewed and updated as appropriate.  This an established patient is here today for: office visit. The patient is here today to discuss having a colonoscopy. Patient's last colonoscopy was done on 11-25-16 by Dr. Vira Santos. Patient reports he has bowel movements once per day. He denies any bleeding or mucus.   Patient reports that he was diagnosed with prostate cancer in June. The patient states he is having brachytherapy on 07-30-22.   The patient is accompanied by his wife, Christian Santos.   Chief Complaint  Patient presents with  Pre-op Exam    BP 138/62  Pulse 55  Temp 36.6 C (97.8 F)  Ht 167.6 cm (5' 5.98")  Wt 79.4 kg (175 lb)  SpO2 95%  BMI 28.26 kg/m   Past Medical History:  Diagnosis Date  Actinic keratosis 05/28/2020  left temple  Basal cell carcinoma 05/28/2020  Chronic gastritis 12/08/2014  Colon polyps  Dysplastic nevus 02/20/2014  left posterior shoulder  GERD without esophagitis 12/10/2014  Hemochromatosis  HLD (hyperlipidemia)  HTN (hypertension)  Insomnia  Osteoarthritis  Polycythemia vera (CMS-HCC)  Psoriasis  Sleep apnea 2020    Past Surgical History:  Procedure Laterality Date  hand surgery 2001  COLONOSCOPY 12/12/2005  Adenomatous Polyp  COLONOSCOPY 02/14/2011  PH Adenomatous Polyp: CBF 02/2016  EGD 02/14/2011  Normal  EGD 12/08/2014  No Intestinal Metaplasia: No repeat per MGR  COLONOSCOPY 11/25/2016  Adenomatous Polyps: CBF 11/2021    Social History   Socioeconomic History  Marital status: Married  Occupational History  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 Mycins = chest pain, [place] = skin test results.  Wool Unknown  Clobetasol Rash  Severe blisters Severe blisters   Current Outpatient Medications  Medication Sig Dispense Refill  acetaminophen (TYLENOL) 650 MG ER tablet Tylenol Arthritis Pain  amLODIPine (NORVASC) 5 MG tablet Take 1 tablet (5 mg total) by mouth once daily Must be seen before any other refills. 30 tablet 0  diphenhydramine HCl (BENADRYL ALLERGY ORAL) Take by mouth  ezetimibe (ZETIA) 10 mg tablet Take 10 mg by mouth once daily  magnesium gluconate (MAG-G) 27 mg (500 mg) tablet Take 1,000 mg by mouth once daily  melatonin 5 mg Cap melatonin  metoprolol succinate (TOPROL-XL) 50 MG XL tablet metoprolol succinate ER 50 mg tablet,extended release 24 hr  multivitamin with minerals tablet Take by mouth.  omeprazole (PRILOSEC) 40 MG DR capsule TAKE 1 CAPSULE BY MOUTH EVERY DAY 90 capsule 3  traZODone (DESYREL) 50 MG tablet Take 50 mg by mouth at bedtime  benzonatate (TESSALON) 100 MG capsule Take 100 mg by mouth 3 (three) times daily as needed (Patient not taking: Reported on 05/20/2022)  doxycycline (VIBRA-TABS) 100  MG tablet (Patient not taking: Reported on 05/20/2022)  Saccharomyces boulardii (FLORASTOR) 250 mg capsule Take 250 mg by mouth once daily (Patient not taking: Reported on 05/20/2022)  traMADoL (ULTRAM) 50 mg tablet 1/2-1 po bid prn (Patient not taking: Reported on 05/20/2022) 20 tablet 0  UNABLE TO FIND Take 1,200 mg by mouth 2 (two) times daily Med Name: Chriss Driver (Patient not taking: Reported on  05/20/2022)  UNABLE TO FIND Take 650 mg by mouth 4 (four) times daily with meals and nightly (Patient not taking: Reported on 05/20/2022)   No current facility-administered medications for this visit.   Family History  Problem Relation Age of Onset  Colon cancer Mother  early 32's.  Arthritis Mother  Lung cancer Mother  Hearing loss Mother  Liver cancer Mother  High blood pressure (Hypertension) Father  Pancreatic cancer Father  Myocardial Infarction (Heart attack) Father  Bladder Cancer Father  Heart disease Father  Heart disease Brother  Myocardial Infarction (Heart attack) Brother  Arthritis Maternal Grandmother  Irritable bowel syndrome Daughter  Thyroid cancer Daughter  Autoimmune disease Daughter  Colon polyps Neg Hx   Labs and Radiology:   Colonoscopy dated November 25, 2016 completed by Christian Santos:  Two, small 5 mm polyps in the ascending and transverse colon. Tubular adenomas on pathology.  Review of Systems  Constitutional: Negative for chills and fever.  Respiratory: Negative for cough.    Objective:  Physical Exam Constitutional:  Appearance: Normal appearance.  Cardiovascular:  Rate and Rhythm: Normal rate and regular rhythm.  Pulses: Normal pulses.  Heart sounds: Murmur heard.  Systolic murmur is present with a grade of 2/6.  Comments: Patient reports cardiac echo showed no significant abnormality. Pulmonary:  Effort: Pulmonary effort is normal.  Breath sounds: Normal breath sounds.  Musculoskeletal:  Cervical back: Neck supple.  Neurological:  Mental Status: He is alert and oriented to person, place, and time.  Psychiatric:  Mood and Affect: Mood normal.  Behavior: Behavior normal.    Assessment:   Candidate for repeat colonoscopy based on past history of polyps and family history of colon cancer (mother).  Plan:   The patient was instructed in regards to preparation by the staff. Arrangements be made for the procedure to be completed  at a convenient date prior to planned brachytherapy to the prostate scheduled for October 2023.  This note is partially prepared by Christian Santos, CMA acting as a scribe in the presence of Dr. Hervey Ard, MD.   The documentation recorded by the scribe accurately reflects the service I personally performed and the decisions made by me.   Robert Bellow, MD FACS

## 2022-06-02 DIAGNOSIS — N5201 Erectile dysfunction due to arterial insufficiency: Secondary | ICD-10-CM | POA: Diagnosis not present

## 2022-06-02 DIAGNOSIS — C61 Malignant neoplasm of prostate: Secondary | ICD-10-CM | POA: Diagnosis not present

## 2022-06-04 ENCOUNTER — Ambulatory Visit
Admission: RE | Admit: 2022-06-04 | Discharge: 2022-06-04 | Disposition: A | Discharge status: Home / Self Care | Payer: Medicare Other | Attending: General Surgery | Admitting: General Surgery

## 2022-06-04 ENCOUNTER — Encounter
Admission: RE | Disposition: A | Discharge status: Home / Self Care | Payer: Self-pay | Source: Home / Self Care | Attending: General Surgery

## 2022-06-04 ENCOUNTER — Other Ambulatory Visit: Payer: Self-pay | Admitting: Family Medicine

## 2022-06-04 ENCOUNTER — Ambulatory Visit: Payer: Medicare Other | Admitting: Anesthesiology

## 2022-06-04 ENCOUNTER — Encounter: Payer: Self-pay | Admitting: General Surgery

## 2022-06-04 DIAGNOSIS — I10 Essential (primary) hypertension: Secondary | ICD-10-CM

## 2022-06-04 DIAGNOSIS — K5730 Diverticulosis of large intestine without perforation or abscess without bleeding: Secondary | ICD-10-CM | POA: Insufficient documentation

## 2022-06-04 DIAGNOSIS — D120 Benign neoplasm of cecum: Secondary | ICD-10-CM | POA: Diagnosis not present

## 2022-06-04 DIAGNOSIS — Z86010 Personal history of colonic polyps: Secondary | ICD-10-CM | POA: Diagnosis not present

## 2022-06-04 DIAGNOSIS — D126 Benign neoplasm of colon, unspecified: Secondary | ICD-10-CM | POA: Diagnosis not present

## 2022-06-04 DIAGNOSIS — K219 Gastro-esophageal reflux disease without esophagitis: Secondary | ICD-10-CM | POA: Insufficient documentation

## 2022-06-04 DIAGNOSIS — K635 Polyp of colon: Secondary | ICD-10-CM | POA: Diagnosis not present

## 2022-06-04 DIAGNOSIS — Z1211 Encounter for screening for malignant neoplasm of colon: Secondary | ICD-10-CM | POA: Insufficient documentation

## 2022-06-04 DIAGNOSIS — Z800 Family history of malignant neoplasm of digestive organs: Secondary | ICD-10-CM | POA: Insufficient documentation

## 2022-06-04 HISTORY — PX: COLONOSCOPY WITH PROPOFOL: SHX5780

## 2022-06-04 SURGERY — COLONOSCOPY WITH PROPOFOL
Anesthesia: General

## 2022-06-04 MED ORDER — PROPOFOL 500 MG/50ML IV EMUL
INTRAVENOUS | Status: DC | PRN
  Administered 2022-06-04: 130 ug/kg/min via INTRAVENOUS

## 2022-06-04 MED ORDER — PROPOFOL 10 MG/ML IV BOLUS
INTRAVENOUS | Status: DC | PRN
  Administered 2022-06-04: 80 mg via INTRAVENOUS
  Administered 2022-06-04: 20 mg via INTRAVENOUS

## 2022-06-04 MED ORDER — PROPOFOL 10 MG/ML IV BOLUS
INTRAVENOUS | Status: AC
  Filled 2022-06-04: qty 20

## 2022-06-04 MED ORDER — LIDOCAINE HCL (CARDIAC) PF 100 MG/5ML IV SOSY
PREFILLED_SYRINGE | INTRAVENOUS | Status: DC | PRN
  Administered 2022-06-04: 50 mg via INTRAVENOUS

## 2022-06-04 MED ORDER — LIDOCAINE HCL (PF) 2 % IJ SOLN
INTRAMUSCULAR | Status: AC
  Filled 2022-06-04: qty 5

## 2022-06-04 MED ORDER — PROPOFOL 1000 MG/100ML IV EMUL
INTRAVENOUS | Status: AC
  Filled 2022-06-04: qty 100

## 2022-06-04 MED ORDER — SODIUM CHLORIDE 0.9 % IV SOLN
INTRAVENOUS | Status: DC
  Administered 2022-06-04: 1000 mL via INTRAVENOUS

## 2022-06-04 NOTE — H&P (Signed)
Christian Santos [id number] 15-Jan-1951     HPI: 71 y/o male with family history of colon cancer. Tolerated prep well.   Medications Prior to Admission  Medication Sig Dispense Refill Last Dose   amLODipine (NORVASC) 5 MG tablet Take 1 tablet (5 mg total) by mouth daily. 90 tablet 3 06/03/2022   aspirin EC (ASPIRIN 81) 81 MG tablet    Past Week   ezetimibe (ZETIA) 10 MG tablet Take 1 tablet (10 mg total) by mouth daily. 90 tablet 3 06/03/2022   Magnesium 500 MG CAPS Take by mouth.   Past Week   melatonin 5 MG TABS Take 5 mg by mouth.   Past Week   metoprolol succinate (TOPROL-XL) 50 MG 24 hr tablet Take 1 tablet by mouth daily.   06/03/2022   Multiple Vitamins-Minerals (MULTIVITAMIN WITH MINERALS) tablet Take 1 tablet by mouth daily.   Past Week   omeprazole (PRILOSEC) 40 MG capsule Take 1 capsule (40 mg total) by mouth daily. 90 capsule 1 06/03/2022   pimecrolimus (ELIDEL) 1 % cream APPLY TO AFFECTED AREAS OF PSORIASIS ONCE OR TWICE DAILY AS NEEDED 60 g 2 06/03/2022   Probiotic Product (PROBIOTIC ADVANCED PO) Take by mouth.   06/03/2022   acetaminophen (TYLENOL) 650 MG CR tablet Take 650 mg by mouth every 8 (eight) hours as needed for pain.      naproxen (NAPROSYN) 500 MG tablet       Allergies  Allergen Reactions   Alcohol Other (See Comments)   Erythromycin Base    Lisinopril Other (See Comments)    cough   Other Other (See Comments)    Mycins = chest pain, [place] = skin test results.    Wool Alcohol  [Lanolin] Other (See Comments)   Clobetasol Rash    Severe blisters    Past Medical History:  Diagnosis Date   Actinic keratosis 05/28/2020   L temple    Allergy 2017   Arthritis 2017   Basal cell carcinoma 05/28/2020   R temple/hairline - ED&C    Dysplastic nevus 02/20/2014   Left posterior shoulder. Moderate atypia, limited margins free.   GERD (gastroesophageal reflux disease)    Hyperlipidemia    Hypertension    Insomnia    Polycythemia vera ([place])    Psoriasis     Sleep apnea 2020   Past Surgical History:  Procedure Laterality Date   HAND SURGERY     Social History   Socioeconomic History   Marital status: Married    Spouse name: Not on file   Number of children: 3   Years of education: Not on file   Highest education level: Not on file  Occupational History   Not on file  Tobacco Use   Smoking status: Never   Smokeless tobacco: Never  Vaping Use   Vaping Use: Never used  Substance and Sexual Activity   Alcohol use: Yes    Alcohol/week: 0.0 standard drinks of alcohol   Drug use: No   Sexual activity: Yes    Partners: Female    Birth control/protection: None  Other Topics Concern   Not on file  Social History Narrative   Not on file   Social Determinants of Health   Financial Resource Strain: Low Risk  (09/20/2020)   Overall Financial Resource Strain (CARDIA)    Difficulty of Paying Living Expenses: Not hard at all  Food Insecurity: No Food Insecurity (09/20/2020)   Hunger Vital Sign    Worried About Running Out  of Food in the Last Year: Never true    [place] in the Last Year: Never true  Transportation Needs: No Transportation Needs (09/20/2020)   PRAPARE - Hydrologist (Medical): No    Lack of Transportation (Non-Medical): No  Physical Activity: Sufficiently Active (09/20/2020)   Exercise Vital Sign    Days of Exercise per Week: 7 days    Minutes of Exercise per Session: 60 min  Stress: No Stress Concern Present (09/20/2020)   [place]    Feeling of Stress : Not at all  Social Connections: Moderately Integrated (09/20/2020)   Social Connection and Isolation Panel [NHANES]    Frequency of Communication with Friends and Family: More than three times a week    Frequency of Social Gatherings with Friends and Family: Three times a week    Attends Religious Services: More than 4 times per year    Active Member of Clubs  or Organizations: No    Attends Archivist Meetings: Never    Marital Status: Married  Human resources officer Violence: Not At Risk (09/20/2020)   Humiliation, Afraid, Rape, and Kick questionnaire    Fear of Current or Ex-Partner: No    Emotionally Abused: No    Physically Abused: No    Sexually Abused: No   Social History   Social History Narrative   Not on file     ROS: Negative.     PE: HEENT: Negative. Lungs: Clear. Cardio: RR.   Assessment/Plan:  Proceed with planned endoscopy.   Christian Santos Christian Santos 06/04/2022

## 2022-06-04 NOTE — Transfer of Care (Signed)
Immediate Anesthesia Transfer of Care Note  Patient: Christian Santos  Procedure(s) Performed: COLONOSCOPY WITH PROPOFOL  Patient Location: PACU and Endoscopy Unit  Anesthesia Type:General  Level of Consciousness: drowsy and patient cooperative  Airway & Oxygen Therapy: Patient Spontanous Breathing  Post-op Assessment: Report given to RN and Post -op Vital signs reviewed and stable  Post vital signs: Reviewed and stable  Last Vitals:  Vitals Value Taken Time  BP 101/69 06/04/22 0755  Temp 36.2 C 06/04/22 0755  Pulse 48 06/04/22 0756  Resp 14 06/04/22 0756  SpO2 96 % 06/04/22 0756  Vitals shown include unvalidated device data.  Last Pain:  Vitals:   06/04/22 0755  TempSrc: Temporal  PainSc: Asleep         Complications: No notable events documented.

## 2022-06-04 NOTE — Anesthesia Postprocedure Evaluation (Signed)
Anesthesia Post Note  Patient: Christian Santos  Procedure(s) Performed: COLONOSCOPY WITH PROPOFOL  Patient location during evaluation: Endoscopy Anesthesia Type: General Level of consciousness: awake and alert Pain management: pain level controlled Vital Signs Assessment: post-procedure vital signs reviewed and stable Respiratory status: spontaneous breathing, nonlabored ventilation and respiratory function stable Cardiovascular status: blood pressure returned to baseline and stable Postop Assessment: no apparent nausea or vomiting Anesthetic complications: no   No notable events documented.   Last Vitals:  Vitals:   06/04/22 0805 06/04/22 0815  BP:    Pulse: (!) 50   Resp:    Temp:    SpO2: 100% 100%    Last Pain:  Vitals:   06/04/22 0815  TempSrc:   PainSc: 0-No pain                 Iran Ouch

## 2022-06-04 NOTE — Anesthesia Preprocedure Evaluation (Addendum)
Anesthesia Evaluation  Patient identified by MRN, date of birth, ID band Patient awake    Reviewed: Allergy & Precautions, NPO status , Patient's Chart, lab work & pertinent test results, reviewed documented beta blocker date and time   Airway Mallampati: II  TM Distance: >3 FB Neck ROM: full    Dental no notable dental hx.    Pulmonary sleep apnea ,    Pulmonary exam normal        Cardiovascular Exercise Tolerance: Good hypertension, Pt. on home beta blockers and Pt. on medications Normal cardiovascular exam     Neuro/Psych negative neurological ROS  negative psych ROS   GI/Hepatic Neg liver ROS, GERD  Controlled,  Endo/Other  negative endocrine ROS  Renal/GU negative Renal ROS  negative genitourinary   Musculoskeletal   Abdominal Normal abdominal exam  (+)   Peds  Hematology negative hematology ROS (+)   Anesthesia Other Findings Past Medical History: 05/28/2020: Actinic keratosis     Comment:  L temple  2017: Allergy 2017: Arthritis 05/28/2020: Basal cell carcinoma     Comment:  R temple/hairline - ED&C  02/20/2014: Dysplastic nevus     Comment:  Left posterior shoulder. Moderate atypia, limited               margins free. No date: GERD (gastroesophageal reflux disease) No date: Hyperlipidemia No date: Hypertension No date: Insomnia No date: Polycythemia vera ([place]) No date: Psoriasis 2020: Sleep apnea  Past Surgical History: No date: HAND SURGERY     Reproductive/Obstetrics negative OB ROS                            Anesthesia Physical Anesthesia Plan  ASA: 2  Anesthesia Plan: General   Post-op Pain Management: Minimal or no pain anticipated   Induction: Intravenous  PONV Risk Score and Plan: Propofol infusion and TIVA  Airway Management Planned: Natural Airway  Additional Equipment:   Intra-op Plan:   Post-operative Plan:   Informed Consent: I have  reviewed the patients History and Physical, chart, labs and discussed the procedure including the risks, benefits and alternatives for the proposed anesthesia with the patient or authorized representative who has indicated his/her understanding and acceptance.     Dental advisory given  Plan Discussed with: Anesthesiologist, CRNA and Surgeon  Anesthesia Plan Comments:        Anesthesia Quick Evaluation

## 2022-06-04 NOTE — Op Note (Signed)
[facility] Patient Name: Christian Santos Procedure Date: 06/04/2022 6:57 AM MRN: [id number] Account #: [id number] Date of Birth: 05-20-1951 Admit Type: Outpatient Age: 71 Room: [facility] ENDO ROOM 1 Gender: Male Note Status: Finalized Instrument Name: Peds Colonoscope [id number] Procedure:             Colonoscopy Indications:           High risk colon cancer surveillance: Personal history                         of colonic polyps, Family history of colon cancer in a                         first-degree relative before age 17 years Providers:             Robert Bellow, MD Referring MD:          [name], MD (Referring MD) Medicines:             Propofol per Anesthesia Complications:         No immediate complications. Procedure:             Pre-Anesthesia Assessment:                        - Prior to the procedure, a History and Physical was                         performed, and patient medications, allergies and                         sensitivities were reviewed. The patient's tolerance                         of previous anesthesia was reviewed.                        - The risks and benefits of the procedure and the                         sedation options and risks were discussed with the                         patient. All questions were answered and informed                         consent was obtained.                        After obtaining informed consent, the colonoscope was                         passed under direct vision. Throughout the procedure,                         the patient's blood pressure, pulse, and oxygen                         saturations were monitored continuously. The  Colonoscope was introduced through the anus and                         advanced to the the cecum, identified by appendiceal                         orifice and ileocecal valve. The colonoscopy was                          performed without difficulty. The patient tolerated                         the procedure well. The quality of the bowel                         preparation was excellent. Findings:      A 5 mm polyp was found in the cecum. The polyp was sessile. Biopsies       were taken with a cold forceps for histology.      A single large-mouthed diverticulum was found in the cecum.      The retroflexed view of the distal rectum and anal verge was normal and       showed no anal or rectal abnormalities. Impression:            - One 5 mm polyp in the cecum. Biopsied.                        - Diverticulosis in the cecum.                        - The distal rectum and anal verge are normal on                         retroflexion view. Recommendation:        - Repeat colonoscopy in 5 years for surveillance. Procedure Code(s):     --- Professional ---                        [phone], Colonoscopy, flexible; with biopsy, single or                         multiple Diagnosis Code(s):     --- Professional ---                        Z86.010, Personal history of colonic polyps                        K63.5, Polyp of colon                        Z80.0, Family history of malignant neoplasm of                         digestive organs                        K57.30, Diverticulosis of large intestine without  perforation or abscess without bleeding CPT copyright 2019 American Medical Association. All rights reserved. The codes documented in this report are preliminary and upon coder review may  be revised to meet current compliance requirements. Robert Bellow, MD 06/04/2022 7:53:59 AM This report has been signed electronically. Number of Addenda: 0 Note Initiated On: 06/04/2022 6:57 AM Scope Withdrawal Time: 0 hours 9 minutes 49 seconds  Total Procedure Duration: 0 hours 13 minutes 44 seconds  Estimated Blood Loss:  Estimated blood loss was minimal.      [facility]

## 2022-06-05 ENCOUNTER — Encounter: Payer: Self-pay | Admitting: General Surgery

## 2022-06-05 LAB — SURGICAL PATHOLOGY

## 2022-06-05 NOTE — Telephone Encounter (Signed)
pt has new PCP Dr [name] as per chart information  Requested Prescriptions  Refused Prescriptions Disp Refills  . ezetimibe (ZETIA) 10 MG tablet [Pharmacy Med Name: EZETIMIBE 10 MG TAB] 90 tablet 3    Sig: TAKE 1 TABELT BY MOUTH DAILY     Cardiovascular:  Antilipid - Sterol Transport Inhibitors Failed - 06/04/2022  6:32 PM      Failed - AST in normal range and within 360 days    AST  Date Value Ref Range Status  06/02/2017 22 10 - 35 U/L Final   SGOT(AST)  Date Value Ref Range Status  02/02/2015 28 U/L Final    Comment:    15-41 NOTE: New Reference Range  12/12/14          Failed - ALT in normal range and within 360 days    ALT  Date Value Ref Range Status  06/02/2017 26 9 - 46 U/L Final   SGPT (ALT)  Date Value Ref Range Status  02/02/2015 27 U/L Final    Comment:    17-63 NOTE: New Reference Range  12/12/14          Failed - Valid encounter within last 12 months    Recent Outpatient Visits          1 year ago Obstructive sleep apnea syndrome   [place], DO   1 year ago Need for 23-polyvalent pneumococcal polysaccharide vaccine   [place], MD   2 years ago Essential hypertension   [facility] [place] Malkin, MD   2 years ago Essential hypertension   [facility] [place] Malkin, MD   4 years ago Pure hypercholesterolemia   [place], MD      Future Appointments            In 7 months Ralene Bathe, MD [place]           Failed - Lipid Panel in normal range within the last 12 months    Cholesterol, Total  Date Value Ref Range Status  03/14/2016 130 100 - 199 mg/dL Final  03/14/2016 130 100 - 199 mg/dL Final   Cholesterol  Date Value Ref Range Status  04/29/2021 150 <200 mg/dL Final   LDL Cholesterol (Calc)  Date Value Ref Range Status   04/29/2021 86 mg/dL (calc) Final    Comment:    Reference range: <100 . Desirable range <100 mg/dL for primary prevention;   <70 mg/dL for patients with CHD or diabetic patients  with > or = 2 CHD risk factors. Marland Kitchen LDL-C is now calculated using the Martin-Hopkins  calculation, which is a validated novel method providing  better accuracy than the Friedewald equation in the  estimation of LDL-C.  Cresenciano Genre et al. Annamaria Helling. 7253;664(40): 2061-2068  (http://education.[employer].com/faq/FAQ164)    HDL  Date Value Ref Range Status  04/29/2021 36 (L) > OR = 40 mg/dL Final  03/14/2016 32 (L) >39 mg/dL Final  03/14/2016 32 (L) >39 mg/dL Final   Triglycerides  Date Value Ref Range Status  04/29/2021 189 (H) <150 mg/dL Final         Passed - Patient is not pregnant      . amLODipine (NORVASC) 5 MG tablet [Pharmacy Med Name: AMLODIPINE BESYLATE 5 MG TAB] 90 tablet 3    Sig: TAKE 1 TABLET BY MOUTH DAILY.  Cardiovascular: Calcium Channel Blockers 2 Failed - 06/04/2022  6:32 PM      Failed - Valid encounter within last 6 months    Recent Outpatient Visits          1 year ago Obstructive sleep apnea syndrome   [place], DO   1 year ago Need for 23-polyvalent pneumococcal polysaccharide vaccine   [place], MD   2 years ago Essential hypertension   [facility] [place] Malkin, MD   2 years ago Essential hypertension   [facility] [place] Malkin, MD   4 years ago Pure hypercholesterolemia   [place], MD      Future Appointments            In 7 months Ralene Bathe, MD [place] BP in normal range    BP Readings from Last 1 Encounters:  06/04/22 101/69         Passed - Last Heart Rate in normal range    Pulse Readings from Last 1 Encounters:   06/04/22 (!) 50

## 2022-06-11 ENCOUNTER — Telehealth: Payer: Self-pay | Admitting: *Deleted

## 2022-06-11 NOTE — Telephone Encounter (Signed)
CALLED PATIENT TO REMIND OF PRE-SEED APPTS. FOR 06-12-22, LVM FOR A RETURN CALL

## 2022-06-12 ENCOUNTER — Ambulatory Visit
Admission: RE | Admit: 2022-06-12 | Discharge: 2022-06-12 | Disposition: A | Discharge status: Home / Self Care | Payer: Medicare Other | Source: Ambulatory Visit | Attending: Urology | Admitting: Urology

## 2022-06-12 ENCOUNTER — Other Ambulatory Visit: Payer: Self-pay

## 2022-06-12 ENCOUNTER — Encounter: Payer: Self-pay | Admitting: Urology

## 2022-06-12 ENCOUNTER — Ambulatory Visit
Admission: RE | Admit: 2022-06-12 | Discharge: 2022-06-12 | Disposition: A | Discharge status: Home / Self Care | Payer: Medicare Other | Source: Ambulatory Visit | Attending: Radiation Oncology | Admitting: Radiation Oncology

## 2022-06-12 VITALS — Resp 19 | Ht 66.0 in | Wt 175.0 lb

## 2022-06-12 DIAGNOSIS — C61 Malignant neoplasm of prostate: Secondary | ICD-10-CM

## 2022-06-12 DIAGNOSIS — Z510 Encounter for antineoplastic radiation therapy: Secondary | ICD-10-CM | POA: Insufficient documentation

## 2022-06-12 DIAGNOSIS — Z191 Hormone sensitive malignancy status: Secondary | ICD-10-CM | POA: Diagnosis not present

## 2022-06-12 NOTE — Progress Notes (Signed)
  Radiation Oncology         [phone] ________________________________  Name: Christian Santos MRN: [id number]  Date: 06/12/2022  DOB: [birth year]  SIMULATION AND TREATMENT PLANNING NOTE PUBIC ARCH STUDY  PZ:WCHEN, Legrand Como, MD  Alexis Frock, MD  DIAGNOSIS: 71 y.o. gentleman with Stage T1c adenocarcinoma of the prostate with Gleason score of 3+3, and PSA of 4.6.  Oncology History  Malignant neoplasm of prostate ([place])  03/18/2022 Cancer Staging   Staging form: Prostate, AJCC 8th Edition - Clinical stage from 03/18/2022: Stage I (cT1c, cN0, cM0, PSA: 4.6, Grade Group: 1) - Signed by Freeman Caldron, PA-C on 04/22/2022 Histopathologic type: Adenocarcinoma, NOS Prostate specific antigen (PSA) range: Less than 10 Gleason primary pattern: 3 Gleason secondary pattern: 3 Gleason score: 6 Histologic grading system: 5 grade system Number of biopsy cores examined: 16 Number of biopsy cores positive: 1 Location of positive needle core biopsies: One side   04/22/2022 Initial Diagnosis   Malignant neoplasm of prostate ([place])       ICD-10-CM   1. Malignant neoplasm of prostate ([place])  C61       COMPLEX SIMULATION:  The patient presented today for evaluation for possible prostate seed implant. He was brought to the radiation planning suite and placed supine on the CT couch. A 3-dimensional image study set was obtained in upload to the planning computer. There, on each axial slice, I contoured the prostate gland. Then, using three-dimensional radiation planning tools I reconstructed the prostate in view of the structures from the transperineal needle pathway to assess for possible pubic arch interference. In doing so, I did not appreciate any pubic arch interference. Also, the patient's prostate volume was estimated based on the drawn structure. The volume was 24 cc.  Given the pubic arch appearance and prostate volume, patient remains a good candidate to proceed with prostate seed implant.  Today, he freely provided informed written consent to proceed.    PLAN: The patient will undergo prostate seed implant.   ________________________________  Sheral Apley. Tammi Klippel, M.D.

## 2022-06-12 NOTE — Progress Notes (Signed)
Radiation Oncology         [phone] ________________________________  Outpatient Follow up- Pre-seed visit  Name: Christian Santos MRN: [id number]  Date: 06/12/2022  DOB: [birth year]  [place]:SHUOH, Legrand Como, MD  Alexis Frock, MD   REFERRING PHYSICIAN: Alexis Frock, MD  DIAGNOSIS: 71 y.o. gentleman with Stage T1c adenocarcinoma of the prostate with Gleason score of 3+3, and PSA of 4.6.    ICD-10-CM   1. Malignant neoplasm of prostate ([place])  C61       HISTORY OF PRESENT ILLNESS: Christian Santos is a 71 y.o. male with a diagnosis of prostate cancer. He was noted to have an elevated PSA of 5.8 in 11/2021 by his primary care physician, Dr. Junius Roads. This remained elevated at 4.6 when repeated on 01/07/22. Therefore, he had a prostate MRI on 02/10/22 for further evaluation and this showed a PI-RADS 5 lesion in the anterior mid gland transitional zone with evidence of extracapsular extension as well as a PI-RADS 3 lesion in the left paramidline mid gland transitional zone. There was no evidence of seminal vesicle involvement, nodal disease, or bone metastasis. Accordingly, he was referred for evaluation in urology by Dr. Erlene Quan on 02/19/22,  digital rectal examination was performed at that time revealing no nodules. The patient was referred to Dr. Tresa Moore for MRI fusion biopsy of the prostate, performed on 03/18/22.  The prostate volume measured 24.75 cc.  Out of 16 core biopsies, 3 were positive.  The maximum Gleason score was 3+3, and this was seen in both samples from ROI #2 and the left apex.  The patient reviewed the biopsy results with his urologist and was kindly referred to [place] for discussion of potential radiation treatment options. We initially met the patient on 04/22/22 and he was most interested in proceeding with brachytherapy and SpaceOAR gel placement for treatment of his disease. He did have his routine screening colonoscopy with Dr. Bary Castilla on 06/04/22 and there were no malignant  findings. He is here today for his pre-procedure imaging for planning and to answer any additional questions he may have about this treatment. His wife, [name], is accompanying him today.  PREVIOUS RADIATION THERAPY: No  PAST MEDICAL HISTORY:  Past Medical History:  Diagnosis Date   Actinic keratosis 05/28/2020   L temple    Allergy 2017   Arthritis 2017   Basal cell carcinoma 05/28/2020   R temple/hairline - ED&C    Dysplastic nevus 02/20/2014   Left posterior shoulder. Moderate atypia, limited margins free.   GERD (gastroesophageal reflux disease)    Hyperlipidemia    Hypertension    Insomnia    Polycythemia vera ([place])    Psoriasis    Sleep apnea 2020      PAST SURGICAL HISTORY: Past Surgical History:  Procedure Laterality Date   COLONOSCOPY WITH PROPOFOL N/A 06/04/2022   Procedure: COLONOSCOPY WITH PROPOFOL;  Surgeon: Robert Bellow, MD;  Location: [facility] ENDOSCOPY;  Service: Endoscopy;  Laterality: N/A;   HAND SURGERY      FAMILY HISTORY:  Family History  Problem Relation Age of Onset   Cancer Mother    Hearing loss Mother    Liver cancer Mother    Lung cancer Mother    Colon cancer Mother    Heart attack Father    Cancer Father    Heart disease Father    Hypertension Father    Bladder Cancer Father    Pancreatic cancer Father    Heart attack Brother    Heart disease  Brother    Arthritis Maternal Grandmother    ADD / ADHD Daughter    Asthma Daughter    Anxiety disorder Daughter    Asthma Daughter     SOCIAL HISTORY:  Social History   Socioeconomic History   Marital status: Married    Spouse name: Not on file   Number of children: 3   Years of education: Not on file   Highest education level: Not on file  Occupational History   Not on file  Tobacco Use   Smoking status: Never   Smokeless tobacco: Never  Vaping Use   Vaping Use: Never used  Substance and Sexual Activity   Alcohol use: Yes    Alcohol/week: 0.0 standard drinks of alcohol    Drug use: No   Sexual activity: Yes    Partners: Female    Birth control/protection: None  Other Topics Concern   Not on file  Social History Narrative   Not on file   Social Determinants of Health   Financial Resource Strain: Low Risk  (09/20/2020)   Overall Financial Resource Strain (CARDIA)    Difficulty of Paying Living Expenses: Not hard at all  Food Insecurity: No Food Insecurity (09/20/2020)   Hunger Vital Sign    Worried About Running Out of Food in the Last Year: Never true    Ran Out of Food in the Last Year: Never true  Transportation Needs: No Transportation Needs (09/20/2020)   PRAPARE - Hydrologist (Medical): No    Lack of Transportation (Non-Medical): No  Physical Activity: Sufficiently Active (09/20/2020)   Exercise Vital Sign    Days of Exercise per Week: 7 days    Minutes of Exercise per Session: 60 min  Stress: No Stress Concern Present (09/20/2020)   [place]    Feeling of Stress : Not at all  Social Connections: Moderately Integrated (09/20/2020)   Social Connection and Isolation Panel [NHANES]    Frequency of Communication with Friends and Family: More than three times a week    Frequency of Social Gatherings with Friends and Family: Three times a week    Attends Religious Services: More than 4 times per year    Active Member of Clubs or Organizations: No    Attends Archivist Meetings: Never    Marital Status: Married  Human resources officer Violence: Not At Risk (09/20/2020)   Humiliation, Afraid, Rape, and Kick questionnaire    Fear of Current or Ex-Partner: No    Emotionally Abused: No    Physically Abused: No    Sexually Abused: No    ALLERGIES: Alcohol, Erythromycin base, Lisinopril, Other, Wool alcohol  [lanolin], and Clobetasol  MEDICATIONS:  Current Outpatient Medications  Medication Sig Dispense Refill   acetaminophen (TYLENOL) 650 MG  CR tablet Take 650 mg by mouth every 8 (eight) hours as needed for pain.     amLODipine (NORVASC) 5 MG tablet Take 1 tablet (5 mg total) by mouth daily. 90 tablet 3   aspirin EC (ASPIRIN 81) 81 MG tablet      ezetimibe (ZETIA) 10 MG tablet Take 1 tablet (10 mg total) by mouth daily. 90 tablet 3   Magnesium 500 MG CAPS Take by mouth.     melatonin 5 MG TABS Take 5 mg by mouth.     metoprolol succinate (TOPROL-XL) 50 MG 24 hr tablet Take 1 tablet by mouth daily.     Multiple Vitamins-Minerals (MULTIVITAMIN  WITH MINERALS) tablet Take 1 tablet by mouth daily.     naproxen (NAPROSYN) 500 MG tablet      omeprazole (PRILOSEC) 40 MG capsule Take 1 capsule (40 mg total) by mouth daily. 90 capsule 1   pimecrolimus (ELIDEL) 1 % cream APPLY TO AFFECTED AREAS OF PSORIASIS ONCE OR TWICE DAILY AS NEEDED 60 g 2   Probiotic Product (PROBIOTIC ADVANCED PO) Take by mouth.     No current facility-administered medications for this encounter.    REVIEW OF SYSTEMS:  On review of systems, the patient reports that he is doing well overall. He denies any chest pain, shortness of breath, cough, fevers, chills, night sweats, unintended weight changes. He denies any bowel disturbances, and denies abdominal pain, nausea or vomiting. He denies any new musculoskeletal or joint aches or pains. His IPSS was 3, indicating mild urinary symptoms. His SHIM was 5, indicating he has severe erectile dysfunction. A complete review of systems is obtained and is otherwise negative.    PHYSICAL EXAM:  Wt Readings from Last 3 Encounters:  06/12/22 175 lb (79.4 kg)  06/04/22 162 lb 5.2 oz (73.6 kg)  04/22/22 173 lb (78.5 kg)   Temp Readings from Last 3 Encounters:  06/04/22 (!) 97.1 F (36.2 C) (Temporal)  04/22/22 97.8 F (36.6 C)  06/06/21 98.8 F (37.1 C) (Oral)   BP Readings from Last 3 Encounters:  06/04/22 101/69  04/22/22 (!) 146/72  03/25/22 (!) 165/78   Pulse Readings from Last 3 Encounters:  06/04/22 (!) 50   04/22/22 (!) 55  03/25/22 (!) 54   Pain Assessment Pain Score: 0-No pain/10  In general this is a well appearing Caucasian male in no acute distress. He's alert and oriented x4 and appropriate throughout the examination. Cardiopulmonary assessment is negative for acute distress, and he exhibits normal effort.     KPS = 100  100 - Normal; no complaints; no evidence of disease. 90   - Able to carry on normal activity; minor signs or symptoms of disease. 80   - Normal activity with effort; some signs or symptoms of disease. 72   - Cares for self; unable to carry on normal activity or to do active work. 60   - Requires occasional assistance, but is able to care for most of his personal needs. 50   - Requires considerable assistance and frequent medical care. 41   - Disabled; requires special care and assistance. 50   - Severely disabled; hospital admission is indicated although death not imminent. 65   - Very sick; hospital admission necessary; active supportive treatment necessary. 10   - Moribund; fatal processes progressing rapidly. 0     - Dead  Karnofsky DA, Abelmann [place], Craver LS and Burchenal [facility] [phone]) The use of the nitrogen mustards in the palliative treatment of carcinoma: with particular reference to bronchogenic carcinoma Cancer 1 634-56  LABORATORY DATA:  Lab Results  Component Value Date   WBC 6.4 06/02/2017   HGB 15.8 06/02/2017   HCT 44.8 06/02/2017   MCV 91.4 06/02/2017   PLT 294 06/02/2017   Lab Results  Component Value Date   NA 140 04/29/2021   K 4.6 04/29/2021   CL 106 04/29/2021   CO2 27 04/29/2021   Lab Results  Component Value Date   ALT 26 06/02/2017   AST 22 06/02/2017   GGT 15 02/02/2015   ALKPHOS 86 06/02/2017   BILITOT 0.6 06/02/2017     RADIOGRAPHY: No results found.  IMPRESSION/PLAN: 1. 71 y.o. gentleman with Stage T1c adenocarcinoma of the prostate with Gleason score of 3+3, and PSA of 4.6. The patient has elected to proceed with  seed implant for treatment of his disease. We reviewed the risks, benefits, short and long-term effects associated with brachytherapy and discussed the role of SpaceOAR in reducing the rectal toxicity associated with radiotherapy.  He appears to have a good understanding of his disease and our treatment recommendations which are of curative intent.  He was encouraged to ask questions that were answered to his stated satisfaction. He has freely signed written consent to proceed today in the office and a copy of this document will be placed in his medical record. His procedure is tentatively scheduled for 07/30/22 in collaboration with Dr. Tresa Moore and we will see him back for his post-procedure visit approximately 3 weeks thereafter. We look forward to continuing to participate in his care. He and his wife know that they are welcome to call with any questions or concerns at any time in the interim.  I personally spent 30 minutes in this encounter including chart review, reviewing radiological studies, meeting face-to-face with the patient, entering orders and completing documentation.    Nicholos Johns, MMS, PA-C [place] [facility] [place]: [phone]  Fax: [phone]

## 2022-06-12 NOTE — Progress Notes (Addendum)
Pre-seed appointment. I verified patient's identity and began nursing interview w/ spouse Jarrell Armond in attendance. No prostate related issues/discomfort reported at this time.  Meaningful use complete. No urinary management medications. Urology appt- Jul 30, 2022 1:00pm w/ Dr. Tresa Moore  Resp 19   Ht '5\' 6"'$  (1.676 m)   Wt 175 lb (79.4 kg)   BMI 28.25 kg/m

## 2022-07-18 DIAGNOSIS — C61 Malignant neoplasm of prostate: Secondary | ICD-10-CM | POA: Diagnosis not present

## 2022-07-18 DIAGNOSIS — N390 Urinary tract infection, site not specified: Secondary | ICD-10-CM | POA: Diagnosis not present

## 2022-07-18 DIAGNOSIS — Z01818 Encounter for other preprocedural examination: Secondary | ICD-10-CM | POA: Diagnosis not present

## 2022-07-23 ENCOUNTER — Encounter (HOSPITAL_BASED_OUTPATIENT_CLINIC_OR_DEPARTMENT_OTHER): Payer: Self-pay | Admitting: Urology

## 2022-07-23 DIAGNOSIS — Z23 Encounter for immunization: Secondary | ICD-10-CM | POA: Diagnosis not present

## 2022-07-23 NOTE — Progress Notes (Signed)
Spoke w/ via phone for pre-op interview--- pt and pt's wife Lab needs dos----     [employer] results------ current ekg in epic/ chart COVID test -----patient states asymptomatic no test needed Arrive at ------- 1100 on 07-29-2022 NPO after MN NO Solid Food.  Clear liquids from MN until--- 1000 Med rec completed Medications to take morning of surgery ----- prilosec, norvasc Diabetic medication ----- n/a Patient instructed no nail polish to be worn day of surgery Patient instructed to bring photo id and insurance card day of surgery Patient aware to have Driver (ride ) / caregiver for 24 hours after surgery --- wife, Christian Santos Patient Special Instructions ----- will do one fleet enema morning of surgery Pre-Op special Istructions ----- n/a Patient verbalized understanding of instructions that were given at this phone interview. Patient denies shortness of breath, chest pain, fever, cough at this phone interview.

## 2022-07-29 ENCOUNTER — Telehealth: Payer: Self-pay | Admitting: *Deleted

## 2022-07-29 NOTE — Telephone Encounter (Signed)
CALLED PATIENT TO REMIND OF PROCEDURE FOR 07-30-22, SPOKE WITH PATIENT AND HE IS AWARE OF THIS PROCEDURE

## 2022-07-30 ENCOUNTER — Ambulatory Visit (HOSPITAL_BASED_OUTPATIENT_CLINIC_OR_DEPARTMENT_OTHER): Payer: Medicare Other | Admitting: Anesthesiology

## 2022-07-30 ENCOUNTER — Ambulatory Visit (HOSPITAL_BASED_OUTPATIENT_CLINIC_OR_DEPARTMENT_OTHER)
Admission: RE | Admit: 2022-07-30 | Discharge: 2022-07-30 | Disposition: A | Discharge status: Home / Self Care | Payer: Medicare Other | Attending: Urology | Admitting: Urology

## 2022-07-30 ENCOUNTER — Encounter (HOSPITAL_BASED_OUTPATIENT_CLINIC_OR_DEPARTMENT_OTHER): Payer: Self-pay | Admitting: Urology

## 2022-07-30 ENCOUNTER — Ambulatory Visit (HOSPITAL_COMMUNITY): Payer: Medicare Other

## 2022-07-30 ENCOUNTER — Other Ambulatory Visit: Payer: Self-pay

## 2022-07-30 ENCOUNTER — Encounter (HOSPITAL_BASED_OUTPATIENT_CLINIC_OR_DEPARTMENT_OTHER)
Admission: RE | Disposition: A | Discharge status: Home / Self Care | Payer: Self-pay | Source: Home / Self Care | Attending: Urology

## 2022-07-30 DIAGNOSIS — R9389 Abnormal findings on diagnostic imaging of other specified body structures: Secondary | ICD-10-CM | POA: Diagnosis not present

## 2022-07-30 DIAGNOSIS — M1990 Unspecified osteoarthritis, unspecified site: Secondary | ICD-10-CM

## 2022-07-30 DIAGNOSIS — Z191 Hormone sensitive malignancy status: Secondary | ICD-10-CM | POA: Diagnosis not present

## 2022-07-30 DIAGNOSIS — C61 Malignant neoplasm of prostate: Secondary | ICD-10-CM | POA: Insufficient documentation

## 2022-07-30 DIAGNOSIS — G4730 Sleep apnea, unspecified: Secondary | ICD-10-CM | POA: Insufficient documentation

## 2022-07-30 DIAGNOSIS — I10 Essential (primary) hypertension: Secondary | ICD-10-CM | POA: Insufficient documentation

## 2022-07-30 DIAGNOSIS — Z01818 Encounter for other preprocedural examination: Secondary | ICD-10-CM

## 2022-07-30 DIAGNOSIS — K219 Gastro-esophageal reflux disease without esophagitis: Secondary | ICD-10-CM | POA: Diagnosis not present

## 2022-07-30 HISTORY — DX: Other cervical disc degeneration, unspecified cervical region: M50.30

## 2022-07-30 HISTORY — DX: Personal history of colonic polyps: Z86.010

## 2022-07-30 HISTORY — DX: Unspecified osteoarthritis, unspecified site: M19.90

## 2022-07-30 HISTORY — PX: SPACE OAR INSTILLATION: SHX6769

## 2022-07-30 HISTORY — DX: Atherosclerotic heart disease of native coronary artery without angina pectoris: I25.10

## 2022-07-30 HISTORY — PX: CYSTOSCOPY: SHX5120

## 2022-07-30 HISTORY — DX: Allergic rhinitis, unspecified: J30.9

## 2022-07-30 HISTORY — DX: Presence of spectacles and contact lenses: Z97.3

## 2022-07-30 HISTORY — PX: RADIOACTIVE SEED IMPLANT: SHX5150

## 2022-07-30 HISTORY — DX: Presence of external hearing-aid: Z97.4

## 2022-07-30 HISTORY — DX: Personal history of adenomatous and serrated colon polyps: Z86.0101

## 2022-07-30 HISTORY — DX: Male erectile dysfunction, unspecified: N52.9

## 2022-07-30 LAB — POCT I-STAT, CHEM 8
BUN: 9 mg/dL (ref 8–23)
Calcium, Ion: 1.2 mmol/L (ref 1.15–1.40)
Chloride: 106 mmol/L (ref 98–111)
Creatinine, Ser: 0.7 mg/dL (ref 0.61–1.24)
Glucose, Bld: 96 mg/dL (ref 70–99)
HCT: 46 % (ref 39.0–52.0)
Hemoglobin: 15.6 g/dL (ref 13.0–17.0)
Potassium: 3.9 mmol/L (ref 3.5–5.1)
Sodium: 141 mmol/L (ref 135–145)
TCO2: 22 mmol/L (ref 22–32)

## 2022-07-30 SURGERY — INSERTION, RADIATION SOURCE, PROSTATE
Anesthesia: General | Site: Perineum

## 2022-07-30 MED ORDER — KETOROLAC TROMETHAMINE 30 MG/ML IJ SOLN
INTRAMUSCULAR | Status: DC | PRN
  Administered 2022-07-30: 30 mg via INTRAVENOUS

## 2022-07-30 MED ORDER — PHENYLEPHRINE 80 MCG/ML (10ML) SYRINGE FOR IV PUSH (FOR BLOOD PRESSURE SUPPORT)
PREFILLED_SYRINGE | INTRAVENOUS | Status: AC
  Filled 2022-07-30: qty 30

## 2022-07-30 MED ORDER — EPHEDRINE SULFATE (PRESSORS) 50 MG/ML IJ SOLN
INTRAMUSCULAR | Status: DC | PRN
  Administered 2022-07-30 (×3): 5 mg via INTRAVENOUS

## 2022-07-30 MED ORDER — LIDOCAINE 2% (20 MG/ML) 5 ML SYRINGE
INTRAMUSCULAR | Status: DC | PRN
  Administered 2022-07-30: 60 mg via INTRAVENOUS

## 2022-07-30 MED ORDER — DEXAMETHASONE SODIUM PHOSPHATE 10 MG/ML IJ SOLN
INTRAMUSCULAR | Status: DC | PRN
  Administered 2022-07-30: 5 mg via INTRAVENOUS

## 2022-07-30 MED ORDER — PHENYLEPHRINE 80 MCG/ML (10ML) SYRINGE FOR IV PUSH (FOR BLOOD PRESSURE SUPPORT)
PREFILLED_SYRINGE | INTRAVENOUS | Status: DC | PRN
  Administered 2022-07-30 (×2): 40 ug via INTRAVENOUS

## 2022-07-30 MED ORDER — MIDAZOLAM HCL 2 MG/2ML IJ SOLN
INTRAMUSCULAR | Status: DC | PRN
  Administered 2022-07-30: 1 mg via INTRAVENOUS

## 2022-07-30 MED ORDER — SODIUM CHLORIDE 0.9 % IR SOLN
Status: DC | PRN
  Administered 2022-07-30: 1000 mL via INTRAVESICAL

## 2022-07-30 MED ORDER — SCOPOLAMINE 1 MG/3DAYS TD PT72
MEDICATED_PATCH | TRANSDERMAL | Status: AC
  Filled 2022-07-30: qty 1

## 2022-07-30 MED ORDER — STERILE WATER FOR IRRIGATION IR SOLN
Status: DC | PRN
  Administered 2022-07-30: 500 mL

## 2022-07-30 MED ORDER — CIPROFLOXACIN IN D5W 400 MG/200ML IV SOLN
400.0000 mg | INTRAVENOUS | Status: AC
  Administered 2022-07-30: 400 mg via INTRAVENOUS

## 2022-07-30 MED ORDER — ROCURONIUM BROMIDE 10 MG/ML (PF) SYRINGE
PREFILLED_SYRINGE | INTRAVENOUS | Status: AC
  Filled 2022-07-30: qty 10

## 2022-07-30 MED ORDER — ACETAMINOPHEN 500 MG PO TABS
1000.0000 mg | ORAL_TABLET | Freq: Once | ORAL | Status: AC
  Administered 2022-07-30: 1000 mg via ORAL

## 2022-07-30 MED ORDER — PROPOFOL 10 MG/ML IV BOLUS
INTRAVENOUS | Status: AC
  Filled 2022-07-30: qty 20

## 2022-07-30 MED ORDER — PROPOFOL 10 MG/ML IV BOLUS
INTRAVENOUS | Status: DC | PRN
  Administered 2022-07-30: 140 mg via INTRAVENOUS
  Administered 2022-07-30: 20 mg via INTRAVENOUS

## 2022-07-30 MED ORDER — EPHEDRINE 5 MG/ML INJ
INTRAVENOUS | Status: AC
  Filled 2022-07-30: qty 10

## 2022-07-30 MED ORDER — SCOPOLAMINE 1 MG/3DAYS TD PT72: 1.0000 | MEDICATED_PATCH | TRANSDERMAL | Status: DC

## 2022-07-30 MED ORDER — TAMSULOSIN HCL 0.4 MG PO CAPS: 0.4000 mg | ORAL_CAPSULE | Freq: Every day | ORAL | 11 refills | Status: AC | PRN

## 2022-07-30 MED ORDER — IOHEXOL 300 MG/ML  SOLN
INTRAMUSCULAR | Status: DC | PRN
  Administered 2022-07-30: 7 mL

## 2022-07-30 MED ORDER — PROPOFOL 1000 MG/100ML IV EMUL
INTRAVENOUS | Status: AC
  Filled 2022-07-30: qty 100

## 2022-07-30 MED ORDER — FLEET ENEMA 7-19 GM/118ML RE ENEM: 1.0000 | ENEMA | Freq: Once | RECTAL | Status: DC

## 2022-07-30 MED ORDER — DIPHENHYDRAMINE HCL 50 MG/ML IJ SOLN
INTRAMUSCULAR | Status: DC | PRN
  Administered 2022-07-30: 12.5 mg via INTRAVENOUS

## 2022-07-30 MED ORDER — TRAMADOL HCL 50 MG PO TABS: 50.0000 mg | ORAL_TABLET | Freq: Four times a day (QID) | ORAL | 0 refills | Status: DC | PRN

## 2022-07-30 MED ORDER — DIPHENHYDRAMINE HCL 50 MG/ML IJ SOLN
INTRAMUSCULAR | Status: AC
  Filled 2022-07-30: qty 1

## 2022-07-30 MED ORDER — LIDOCAINE HCL (PF) 2 % IJ SOLN
INTRAMUSCULAR | Status: AC
  Filled 2022-07-30: qty 15

## 2022-07-30 MED ORDER — SCOPOLAMINE 1 MG/3DAYS TD PT72
MEDICATED_PATCH | TRANSDERMAL | Status: DC | PRN
  Administered 2022-07-30: 1 via TRANSDERMAL

## 2022-07-30 MED ORDER — CIPROFLOXACIN IN D5W 400 MG/200ML IV SOLN
INTRAVENOUS | Status: AC
  Filled 2022-07-30: qty 200

## 2022-07-30 MED ORDER — ROCURONIUM BROMIDE 10 MG/ML (PF) SYRINGE
PREFILLED_SYRINGE | INTRAVENOUS | Status: DC | PRN
  Administered 2022-07-30: 50 mg via INTRAVENOUS
  Administered 2022-07-30: 10 mg via INTRAVENOUS

## 2022-07-30 MED ORDER — PHENYLEPHRINE 80 MCG/ML (10ML) SYRINGE FOR IV PUSH (FOR BLOOD PRESSURE SUPPORT)
PREFILLED_SYRINGE | INTRAVENOUS | Status: AC
  Filled 2022-07-30: qty 10

## 2022-07-30 MED ORDER — PROPOFOL 500 MG/50ML IV EMUL
INTRAVENOUS | Status: DC | PRN
  Administered 2022-07-30: 25 ug/kg/min via INTRAVENOUS

## 2022-07-30 MED ORDER — SODIUM CHLORIDE (PF) 0.9 % IJ SOLN
INTRAMUSCULAR | Status: DC | PRN
  Administered 2022-07-30: 10 mL

## 2022-07-30 MED ORDER — FENTANYL CITRATE (PF) 250 MCG/5ML IJ SOLN
INTRAMUSCULAR | Status: DC | PRN
  Administered 2022-07-30 (×2): 50 ug via INTRAVENOUS

## 2022-07-30 MED ORDER — LACTATED RINGERS IV SOLN
INTRAVENOUS | Status: DC
  Administered 2022-07-30: 1000 mL via INTRAVENOUS

## 2022-07-30 MED ORDER — PROPOFOL 500 MG/50ML IV EMUL
INTRAVENOUS | Status: AC
  Filled 2022-07-30: qty 50

## 2022-07-30 MED ORDER — MIDAZOLAM HCL 2 MG/2ML IJ SOLN
INTRAMUSCULAR | Status: AC
  Filled 2022-07-30: qty 2

## 2022-07-30 MED ORDER — ONDANSETRON HCL 4 MG/2ML IJ SOLN
INTRAMUSCULAR | Status: DC | PRN
  Administered 2022-07-30: 4 mg via INTRAVENOUS

## 2022-07-30 MED ORDER — FENTANYL CITRATE (PF) 100 MCG/2ML IJ SOLN
INTRAMUSCULAR | Status: AC
  Filled 2022-07-30: qty 2

## 2022-07-30 MED ORDER — ACETAMINOPHEN 500 MG PO TABS
ORAL_TABLET | ORAL | Status: AC
  Filled 2022-07-30: qty 2

## 2022-07-30 SURGICAL SUPPLY — 46 items
BAG DRN RND TRDRP ANRFLXCHMBR (UROLOGICAL SUPPLIES) ×2
BAG URINE DRAIN 2000ML AR STRL (UROLOGICAL SUPPLIES) ×2 IMPLANT
BLADE CLIPPER SENSICLIP SURGIC (BLADE) ×2 IMPLANT
Bard Brachysource i-125 implant seed IMPLANT
CATH FOLEY 2WAY SLVR  5CC 16FR (CATHETERS) ×2
CATH FOLEY 2WAY SLVR 5CC 16FR (CATHETERS) ×2 IMPLANT
CATH ROBINSON RED A/P 16FR (CATHETERS) IMPLANT
CATH ROBINSON RED A/P 20FR (CATHETERS) ×2 IMPLANT
CLOTH BEACON ORANGE TIMEOUT ST (SAFETY) ×2 IMPLANT
CNTNR URN SCR LID CUP LEK RST (MISCELLANEOUS) ×2 IMPLANT
CONT SPEC 4OZ STRL OR WHT (MISCELLANEOUS) ×2
COVER BACK TABLE 60X90IN (DRAPES) ×2 IMPLANT
COVER MAYO STAND STRL (DRAPES) ×2 IMPLANT
DRSG TEGADERM 4X4.75 (GAUZE/BANDAGES/DRESSINGS) ×2 IMPLANT
DRSG TEGADERM 8X12 (GAUZE/BANDAGES/DRESSINGS) ×2 IMPLANT
GEL ULTRASOUND 20GR AQUASONIC (MISCELLANEOUS) ×4 IMPLANT
GLOVE BIO SURGEON STRL SZ 6 (GLOVE) IMPLANT
GLOVE BIO SURGEON STRL SZ 6.5 (GLOVE) IMPLANT
GLOVE BIO SURGEON STRL SZ7.5 (GLOVE) ×2 IMPLANT
GLOVE BIO SURGEON STRL SZ8 (GLOVE) IMPLANT
GLOVE BIOGEL PI IND STRL 6.5 (GLOVE) IMPLANT
GLOVE SURG ORTHO 8.5 STRL (GLOVE) ×2 IMPLANT
GLOVE SURG SS PI 6.5 STRL IVOR (GLOVE) IMPLANT
GOWN STRL REUS W/TWL LRG LVL3 (GOWN DISPOSABLE) ×2 IMPLANT
GRID BRACH TEMP 18GA 2.8X3X.75 (MISCELLANEOUS) ×2 IMPLANT
HOLDER FOLEY CATH W/STRAP (MISCELLANEOUS) IMPLANT
IMPL SPACEOAR VUE SYSTEM (Spacer) ×2 IMPLANT
IMPLANT SPACEOAR VUE SYSTEM (Spacer) ×2 IMPLANT
IV NS 1000ML (IV SOLUTION) ×2
IV NS 1000ML BAXH (IV SOLUTION) ×2 IMPLANT
KIT TURNOVER CYSTO (KITS) ×2 IMPLANT
NDL BRACHY 18G 5PK (NEEDLE) ×8 IMPLANT
NDL BRACHY 18G SINGLE (NEEDLE) IMPLANT
NDL PK MORGANSTERN STABILIZ (NEEDLE) ×2 IMPLANT
NEEDLE BRACHY 18G 5PK (NEEDLE) ×8 IMPLANT
NEEDLE BRACHY 18G SINGLE (NEEDLE) IMPLANT
NEEDLE PK MORGANSTERN STABILIZ (NEEDLE) ×2 IMPLANT
PACK CYSTO (CUSTOM PROCEDURE TRAY) ×2 IMPLANT
SHEATH ULTRASOUND LF (SHEATH) IMPLANT
SHEATH ULTRASOUND LTX NONSTRL (SHEATH) IMPLANT
SUT BONE WAX W31G (SUTURE) IMPLANT
SYR 10ML LL (SYRINGE) ×2 IMPLANT
SYR CONTROL 10ML LL (SYRINGE) ×2 IMPLANT
TOWEL OR 17X26 10 PK STRL BLUE (TOWEL DISPOSABLE) ×2 IMPLANT
UNDERPAD 30X36 HEAVY ABSORB (UNDERPADS AND DIAPERS) ×4 IMPLANT
WATER STERILE IRR 500ML POUR (IV SOLUTION) ×2 IMPLANT

## 2022-07-30 NOTE — Progress Notes (Signed)
Radiation Oncology         [phone] ________________________________  Name: Christian Santos MRN: [id number]  Date: 07/30/2022  DOB: 1950-12-08       Prostate Seed Implant  NF:AOZHY, Christian Como, MD  No ref. provider found  DIAGNOSIS:  71 y.o. gentleman with Stage T1c adenocarcinoma of the prostate with Gleason score of 3+3, and PSA of 4.6.  Oncology History  Malignant neoplasm of prostate ([place])  03/18/2022 Cancer Staging   Staging form: Prostate, AJCC 8th Edition - Clinical stage from 03/18/2022: Stage I (cT1c, cN0, cM0, PSA: 4.6, Grade Group: 1) - Signed by Freeman Caldron, PA-C on 04/22/2022 Histopathologic type: Adenocarcinoma, NOS Prostate specific antigen (PSA) range: Less than 10 Gleason primary pattern: 3 Gleason secondary pattern: 3 Gleason score: 6 Histologic grading system: 5 grade system Number of biopsy cores examined: 16 Number of biopsy cores positive: 1 Location of positive needle core biopsies: One side   04/22/2022 Initial Diagnosis   Malignant neoplasm of prostate ([place])       ICD-10-CM   1. Pre-op testing  Z01.818 CBG per Guidelines for Diabetes Management for Patients Undergoing Surgery ([facility], [facility], and [facility] only)    CBG per protocol    I-Stat, Chem 8 on day of surgery per protocol    CBG per Guidelines for Diabetes Management for Patients Undergoing Surgery ([facility], [facility], and [facility] only)    CBG per protocol    I-Stat, Chem 8 on day of surgery per protocol      PROCEDURE: Insertion of radioactive I-125 seeds into the prostate gland.  RADIATION DOSE: 145 Gy, definitive therapy.  TECHNIQUE: Christian Santos was brought to the operating room with the urologist. He was placed in the dorsolithotomy position. He was catheterized and a rectal tube was inserted. The perineum was shaved, prepped and draped. The ultrasound probe was then introduced into the rectum to see the prostate gland.  TREATMENT DEVICE: A needle grid was attached to the ultrasound probe stand and  anchor needles were placed.  3D PLANNING: The prostate was imaged in 3D using a sagittal sweep of the prostate probe. These images were transferred to the planning computer. There, the prostate, urethra and rectum were defined on each axial reconstructed image. Then, the software created an optimized 3D plan and a few seed positions were adjusted. The quality of the plan was reviewed using [facility] information for the target and the following two organs at risk:  Urethra and Rectum.  Then the accepted plan was printed and handed off to the radiation therapist.  Under my supervision, the custom loading of the seeds and spacers was carried out and loaded into sealed vicryl sleeves.  These pre-loaded needles were then placed into the needle holder.Marland Kitchen  PROSTATE VOLUME STUDY:  Using transrectal ultrasound the volume of the prostate was verified to be 22 cc.  SPECIAL TREATMENT PROCEDURE/SUPERVISION AND HANDLING: The pre-loaded needles were then delivered under sagittal guidance. A total of 18 needles were used to deposit 58 seeds in the prostate gland. The individual seed activity was 0.342 mCi.  SpaceOAR:  Yes  COMPLEX SIMULATION: At the end of the procedure, an anterior radiograph of the pelvis was obtained to document seed positioning and count. Cystoscopy was performed to check the urethra and bladder.  MICRODOSIMETRY: At the end of the procedure, the patient was emitting 0.068 mR/hr at 1 meter. Accordingly, he was considered safe for hospital discharge.  PLAN: The patient will return to the radiation oncology clinic for post implant  CT dosimetry in three weeks.   ________________________________  Sheral Apley Tammi Klippel, M.D.

## 2022-07-30 NOTE — Discharge Instructions (Addendum)
1 - You may have urinary urgency (bladder spasms) and bloody urine on / off for up to 2 weeks. This is normal.  2 - Call MD or go to ER for fever >102, severe pain / nausea / vomiting not relieved by medications, or acute change in medical status    No acetaminophen/Tylenol until after 6:00pm today if needed for pain.   No ibuprofen, Advil, Aleve, Motrin, ketorolac, meloxicam, naproxen, or other NSAIDS until after 8:22pm today if needed for pain.       Radioactive Seed Implant Home Care Instructions   Activity:    Rest for the remainder of the day.  Do not drive or operate equipment today.  You may resume normal  activities in a few days as instructed by your physician, without risk of harmful radiation exposure to those around you, provided you follow the time and distance precautions on the Radiation Oncology Instruction Sheet.   Meals: Drink plenty of lipuids and eat light foods, such as gelatin or soup this evening .  You may return to normal meal plan tomorrow.  Return To Work: You may return to work as instructed by Naval architect.  Special Instruction:   If any seeds are found, use tweezers to pick up seeds and place in a glass container of any kind and bring to your physician's office.  Call your physician if any of these symptoms occur:  Persistent or heavy bleeding Urine stream diminishes or stops completely after catheter is removed Fever equal to or greater than 101 degrees F Cloudy urine with a strong foul odor Severe pain  You may feel some burning pain and/or hesitancy when you urinate after the catheter is removed.  These symptoms may increase over the next few weeks, but should diminish within forur to six weeks.  Applying moist heat to the lower abdomen or a hot tub bath may help relieve the pain.  If the discomfort becomes severe, please call your physician for additional medications.  PROSTATE CANCER TREATMENT WITH RADIOACTIVE IODINE-125 SEED IMPLANT  This  instruction sheet is intended to discuss implantation of Iodine-125 seeds as treatment for cancer of the prostate. It will explain in detail what you may expect from this treatment and what precautions are necessary as a result of the treatment. Iodine-125 emits a relatively low energy radiation. The radioactive seeds are surgically implanted directly into the prostate gland. Most of the radiation is contained within the prostate gland. A very small amount is present outside the body.The precautions that we ask you to take are to ensure that those around you are protected from unnecessary radiation. The principles of radiation safety that you need to understand are:  DISTANCE: The further a person is from the radioactive implant the less radiation they will be receiving. The amount of radiation received falls off quite rapidly with distance. More specific guidelines are given in the table on the last page.  TIME: The amount of radiation a person is exposed to is directly proportional to the amount of time that is spent in close proximity to the radioactive implant. Very little radiation will be received during short periods. See the table on the last page for more specific guideline.  CHILDREN UNDER AGE 62 Children should not be allowed to sit on your lap or otherwise be in very close contact for more than a few minutes for the first 6-8 weeks following the implant. You may affectionately greet (hug/kiss) a child for a short period of time, but remember,  the longer you are in close proximity with that child the more radiation they are being exposed to. At a distance of 6 feet there is no limit to the length of time you may spend together. See specific guidelines on the last page.  PREGNANT OR POSSIBLY PREGNANT WOMEN Pregnant women should avoid prolonged close physical contact with you for the first 6-8 weeks after implant. At a distance of 6 feet there is no limit to the length of time you may spend  together. Pregnant women or possibly pregnant women can safely be in close contact with you for a limited period of time. See the last page for guidelines.  FAMILY RELATIONS You may sleep in the same bed as your partner (provided she is not pregnant or under the age of 54). Sexual intercourse, using a condom, may be resumed 2 weeks after the implant. Your semen may be discolored, dark brown or black. This is normal and is the result of bleeding that may have occurred during the implant. After 3-4 weeks it will not be necessary to use a condom.  DAILY ACTIVITIES You may resume normal activities in a few days (example: work, shopping, church) without the risk of harmful radiation exposure to those around you provided you keep in mind the time and distance precautions. Objects that you touch or item that you use do not become radioactive. Linens, clothing, tableware, and dishes may be used by other persons without special precautions. Your bodily wastes (urine and stool) are not radioactive.  SPECIAL PRECAUTIONS It is possible to lose implanted Iodine-125 seed(s) through urination. Although it is possible to pass seeds indefinitely, it is most likely to occur immediately after catheter removal. To prevent this from happening the catheter that was in place during the implant procedure is removed immediately after the implant and a cystoscopy procedure is performed. The process of removing the catheter and the cystoscopy procedure should dislodge and remove any seeds that are not firmly imbedded in the prostate tissue. However, you should watch for seeds if/when you remove your catheter at home. The seeds are silver colored and the size of a grain of rice. In the unlikely event that a seed is seen after urination, simply flush the seed down the toilet. The seed should not be handled with your fingers, not even with a glove or napkin. A spoon or tweezers can be used to pick up a seed. The Radiation Oncology  department is open Monday - Friday from 8:00 am to 5:30 pm with a Radiation Oncologist on call at all times. He or she may be reached by calling [phone]. If you are to be hospitalized or if death should occur, your family should notify the Runner, broadcasting/film/video.  SIDE EFFECTS There are very few side effects associate with the implant procedure. Minor burning with urination, weak stream, hesitancy, intermittency, frequency, mild pain or feeling unable to pass your urine freely are common and usually stop in one to four months. If these symptoms are extremely uncomfortable, contact your physician.   RADIATION SAFETY GUIDELINES PROSTATE CANCER TREATMENT WITH RADIOACTIVE IODINE-125 SEED IMPLANT  The following guidelines will limit exposure to less than naturally occurring background radiation.  PERSONS AGE 6-45 (if able to become pregnant)  FOR 8 WEEKS FOLLOWING IMPLANT  At a distance of 1 foot: limit time to less than 2 hours/week At a distance of 3 feet: limit time to 20 hours/week At a distance of 6 feet: no restrictions  AFTER 8 WEEKS No  restrictions  CHILDREN UNDER AGE 53, PREGNANT WOMEN OR POSSIBLY PREGNANT WOMEN  FOR 8 WEEKS FOLLOWING IMPLANT At a distance of 1 foot: limit time to 10 minutes/week At a distance of 3 feet: limit time to 2 hours/week At a distance of 6 feet: no restrictions  AFTER 8 WEEKS No restrictions  PERSONS OVER THE AGE OF 45 AND DO NOT EXPECT TO HAVE ANY MORE CHILDREN No restrictions        Post Anesthesia Home Care Instructions  Activity: Get plenty of rest for the remainder of the day. A responsible individual must stay with you for 24 hours following the procedure.  For the next 24 hours, DO NOT: -Drive a car -Paediatric nurse -Drink alcoholic beverages -Take any medication unless instructed by your physician -Make any legal decisions or sign important papers.  Meals: Start with liquid foods such as gelatin or soup.  Progress to regular foods as tolerated. Avoid greasy, spicy, heavy foods. If nausea and/or vomiting occur, drink only clear liquids until the nausea and/or vomiting subsides. Call your physician if vomiting continues.  Special Instructions/Symptoms: Your throat may feel dry or sore from the anesthesia or the breathing tube placed in your throat during surgery. If this causes discomfort, gargle with warm salt water. The discomfort should disappear within 24 hours.  If you had a scopolamine patch placed behind your ear for the management of post- operative nausea and/or vomiting:  1. The medication in the patch is effective for 72 hours, after which it should be removed.  Wrap patch in a tissue and discard in the trash. Wash hands thoroughly with soap and water. 2. You may remove the patch earlier than 72 hours if you experience unpleasant side effects which may include dry mouth, dizziness or visual disturbances. 3. Avoid touching the patch. Wash your hands with soap and water after contact with the patch.

## 2022-07-30 NOTE — Brief Op Note (Signed)
07/30/2022  2:34 PM  PATIENT:  Christian Santos  71 y.o. male  PRE-OPERATIVE DIAGNOSIS:  PROSTATE CANCER  POST-OPERATIVE DIAGNOSIS:  PROSTATE CANCER  PROCEDURE:  Procedure(s): RADIOACTIVE SEED IMPLANT/BRACHYTHERAPY IMPLANT (N/A) SPACE OAR INSTILLATION (N/A) CYSTOSCOPY  SURGEON:  Surgeon(s) and Role:    * Alexis Frock, MD - Primary    * Tyler Pita, MD  PHYSICIAN ASSISTANT:   ASSISTANTS: none   ANESTHESIA:   general  EBL:  0 mL   BLOOD ADMINISTERED:none  DRAINS: none   LOCAL MEDICATIONS USED:  NONE  SPECIMEN:  No Specimen  DISPOSITION OF SPECIMEN:  N/A  COUNTS:  YES  TOURNIQUET:  * No tourniquets in log *  DICTATION: .Other Dictation: Dictation Number [id number]  PLAN OF CARE: Discharge to home after PACU  PATIENT DISPOSITION:  PACU - hemodynamically stable.   Delay start of Pharmacological VTE agent (>24hrs) due to surgical blood loss or risk of bleeding: yes

## 2022-07-30 NOTE — Anesthesia Procedure Notes (Addendum)
Procedure Name: Intubation Date/Time: 07/30/2022 1:22 PM  Performed by: Clearnce Sorrel, CRNAPre-anesthesia Checklist: Patient identified, Emergency Drugs available, Suction available and Patient being monitored Patient Re-evaluated:Patient Re-evaluated prior to induction Oxygen Delivery Method: Circle System Utilized Preoxygenation: Pre-oxygenation with 100% oxygen Induction Type: IV induction Ventilation: Mask ventilation without difficulty Laryngoscope Size: Mac, 4 and Glidescope (Grade 4 view with MAC 4. proceed with glidescop for anticipated difficult intubation) Grade View: Grade II Tube type: Oral Tube size: 7.5 mm Number of attempts: 1 Airway Equipment and Method: Stylet and Oral airway Placement Confirmation: ETT inserted through vocal cords under direct vision, positive ETCO2 and breath sounds checked- equal and bilateral Secured at: 23 cm Tube secured with: Tape Dental Injury: Teeth and Oropharynx as per pre-operative assessment

## 2022-07-30 NOTE — Transfer of Care (Signed)
Immediate Anesthesia Transfer of Care Note  Patient: Christian Santos  Procedure(s) Performed: RADIOACTIVE SEED IMPLANT/BRACHYTHERAPY IMPLANT (Perineum) SPACE OAR INSTILLATION (Perineum) CYSTOSCOPY (Bladder)  Patient Location: PACU  Anesthesia Type:General  Level of Consciousness: sedated  Airway & Oxygen Therapy: Patient Spontanous Breathing and Patient connected to face mask oxygen  Post-op Assessment: Report given to RN and Post -op Vital signs reviewed and stable  Post vital signs: Reviewed and stable  Last Vitals:  Vitals Value Taken Time  BP 130/62 07/30/22 1445  Temp    Pulse 63 07/30/22 1446  Resp 14 07/30/22 1446  SpO2 99 % 07/30/22 1446  Vitals shown include unvalidated device data.  Last Pain:  Vitals:   07/30/22 1125  TempSrc: Oral  PainSc: 0-No pain      Patients Stated Pain Goal: 5 (41/71/27 8718)  Complications: No notable events documented.

## 2022-07-30 NOTE — H&P (Signed)
Christian Santos is an 71 y.o. male.    Chief Complaint: Pre-Op Prostate Brachytherapy Seed Implantation + SPACE-OAR  HPI:   1 - Low Risk Prostate Cancer - 3 cores up to 50% grade 1 cancer on MRI fusion / template BX 11/7739. Positive areas Left apical / anterior. PSA 5.8. TRUS 25 mL, No median. Tentatively placed on surveillance. He does have strong family history of non-prostate cancers. He is now scheduled to proceed with brachytherapy 07/2022 as he was understandibly anxious on surveillance.   PMH sig for HTN, hand surgery. No CV disease / blood thinners. No chest/abd surgery. He is Chief Financial Officer nearing retirement, mostly industrial, little residential work. His wife [name] is very involved. His PCP is Nicola Police MD.   Today "Christian Santos" is seen to proceed with prostate brachytherapy seed implantation and SPACE-OAR. No interval fevers. Most recent UCX negative.    Past Medical History:  Diagnosis Date   Actinic keratosis 05/28/2020   L temple    Allergic rhinitis    Coronary artery calcification seen on CAT scan    followed by pcp;    CT i epic 11-11-2020  score = 1616//   echo in epic 11-13-2021  ef 60-65%,  mild LAE,  mild MR/ TR no stenosis   DDD (degenerative disc disease), cervical    ED (erectile dysfunction)    GERD (gastroesophageal reflux disease)    History of adenomatous polyp of colon    History of basal cell carcinoma (BCC) excision 05/28/2020   R temple/hairline - ED&C    History of dysplastic nevus 02/20/2014   Left posterior shoulder. Moderate atypia, limited margins free.   Hyperlipidemia    Hypertension    nuclear stress test in epic 05-21-2016  low risk no ischemia normal perfusion, ef 66%   Insomnia    Malignant neoplasm prostate [facility]) 03/2022   urologist--  dr Tresa Moore  radiation oncology--- dr Tammi Klippel;   dx 06/ 2023  Gleason 3+3   OA (osteoarthritis)    OSA (obstructive sleep apnea) 2020   per pt intolerate to cpap   Psoriasis    Wears glasses     Wears hearing aid in both ears     Past Surgical History:  Procedure Laterality Date   COLONOSCOPY WITH PROPOFOL N/A 06/04/2022   Procedure: COLONOSCOPY WITH PROPOFOL;  Surgeon: Robert Bellow, MD;  Location: [facility] ENDOSCOPY;  Service: Endoscopy;  Laterality: N/A;   HAND SURGERY      Family History  Problem Relation Age of Onset   Cancer Mother    Hearing loss Mother    Liver cancer Mother    Lung cancer Mother    Colon cancer Mother    Heart attack Father    Cancer Father    Heart disease Father    Hypertension Father    Bladder Cancer Father    Pancreatic cancer Father    Heart attack Brother    Heart disease Brother    Arthritis Maternal Grandmother    ADD / ADHD Daughter    Asthma Daughter    Anxiety disorder Daughter    Asthma Daughter    Social History:  reports that he has never smoked. He has never used smokeless tobacco. He reports that he does not currently use alcohol. He reports that he does not use drugs.  Allergies:  Allergies  Allergen Reactions   Erythromycin Base    Lisinopril Other (See Comments)    cough   Other Other (See Comments)  Mycins = chest pain, [place] = skin test results.    Clobetasol Rash    Severe blisters    Wool Alcohol  [Lanolin] Rash    No medications prior to admission.    No results found for this or any previous visit (from the past 48 hour(s)). No results found.  Review of Systems  Constitutional:  Negative for chills and fever.  All other systems reviewed and are negative.   Height '5\' 6"'$  (1.676 m), weight 74.8 kg. Physical Exam Vitals reviewed.  HENT:     Head: Normocephalic.  Eyes:     Pupils: Pupils are equal, round, and reactive to light.  Cardiovascular:     Rate and Rhythm: Normal rate.  Pulmonary:     Effort: Pulmonary effort is normal.  Abdominal:     General: Abdomen is flat.  Genitourinary:    Comments: No CVAT at present Musculoskeletal:        General: Normal range of motion.      Cervical back: Normal range of motion.  Neurological:     General: No focal deficit present.     Mental Status: He is alert.  Psychiatric:        Mood and Affect: Mood normal.      Assessment/Plan  Proceed as planned with prostate brachytherapy seed implantation, cysto, SPACE-OAR for primary management of prostate cancer. Risks, benefits, alternatives, expected peri-op course discussed previously and reiterated today.   Alexis Frock, MD 07/30/2022, 8:55 AM

## 2022-07-30 NOTE — Anesthesia Preprocedure Evaluation (Addendum)
Anesthesia Evaluation  Patient identified by MRN, date of birth, ID band Patient awake    Reviewed: Allergy & Precautions, H&P , NPO status , Patient's Chart, lab work & pertinent test results, reviewed documented beta blocker date and time   Airway Mallampati: III  TM Distance: >3 FB Neck ROM: Full    Dental no notable dental hx. (+) Teeth Intact, Dental Advisory Given   Pulmonary sleep apnea ,    Pulmonary exam normal breath sounds clear to auscultation       Cardiovascular hypertension, Pt. on medications and Pt. on home beta blockers  Rhythm:Regular Rate:Normal     Neuro/Psych negative neurological ROS  negative psych ROS   GI/Hepatic Neg liver ROS, GERD  ,  Endo/Other  negative endocrine ROS  Renal/GU negative Renal ROS  negative genitourinary   Musculoskeletal  (+) Arthritis , Osteoarthritis,    Abdominal   Peds  Hematology negative hematology ROS (+)   Anesthesia Other Findings   Reproductive/Obstetrics negative OB ROS                            Anesthesia Physical Anesthesia Plan  ASA: 3  Anesthesia Plan: General   Post-op Pain Management: Tylenol PO (pre-op)*   Induction: Intravenous  PONV Risk Score and Plan: 3 and Ondansetron, Dexamethasone and Treatment may vary due to age or medical condition  Airway Management Planned: Oral ETT  Additional Equipment:   Intra-op Plan:   Post-operative Plan: Extubation in OR  Informed Consent: I have reviewed the patients History and Physical, chart, labs and discussed the procedure including the risks, benefits and alternatives for the proposed anesthesia with the patient or authorized representative who has indicated his/her understanding and acceptance.     Dental advisory given  Plan Discussed with: CRNA  Anesthesia Plan Comments:         Anesthesia Quick Evaluation

## 2022-07-31 ENCOUNTER — Encounter (HOSPITAL_BASED_OUTPATIENT_CLINIC_OR_DEPARTMENT_OTHER): Payer: Self-pay | Admitting: Urology

## 2022-07-31 NOTE — Anesthesia Postprocedure Evaluation (Signed)
Anesthesia Post Note  Patient: Christian Santos  Procedure(s) Performed: RADIOACTIVE SEED IMPLANT/BRACHYTHERAPY IMPLANT (Perineum) SPACE OAR INSTILLATION (Perineum) CYSTOSCOPY (Bladder)     Patient location during evaluation: PACU Anesthesia Type: General Level of consciousness: awake and alert Pain management: pain level controlled Vital Signs Assessment: post-procedure vital signs reviewed and stable Respiratory status: spontaneous breathing, nonlabored ventilation and respiratory function stable Cardiovascular status: blood pressure returned to baseline and stable Postop Assessment: no apparent nausea or vomiting Anesthetic complications: no   No notable events documented.  Last Vitals:  Vitals:   07/30/22 1515 07/30/22 1600  BP: 136/71 (!) 140/80  Pulse: 65 63  Resp: 10 14  Temp: 36.4 C (!) 36.4 C  SpO2: 99% 98%    Last Pain:  Vitals:   07/30/22 1515  TempSrc:   PainSc: 0-No pain                 Lugene Hitt,W. EDMOND

## 2022-07-31 NOTE — Op Note (Signed)
NAME: Christian, Santos MEDICAL RECORD NO: [id number] ACCOUNT NO: [id number] DATE OF BIRTH: 1951-06-05 FACILITY: [facility] LOCATION: [facility] PHYSICIAN: Alexis Frock, MD  Operative Report   DATE OF PROCEDURE: 07/30/2022  PREOPERATIVE DIAGNOSIS:  Low risk prostate cancer with abnormal MRI of the prostate.  PROCEDURES:  1.  Prostate brachytherapy seed implantation. 2.  Space-OAR implantation. 3.  Cystoscopy.  ESTIMATED BLOOD LOSS:  Nil.  COMPLICATIONS:  None.  SPECIMEN:  None.  FINDINGS:  1.  Excellent placement of brachytherapy seeds by spot fluoroscopy. 2.  No evidence of intraluminal brachytherapy seeds with cystoscopy. 3.  Excellent posterior displacement of the anterior rectal wall with Space-OAR implantation. 4.  Radiation parameters 58 seeds across 18 catheters.  INDICATIONS:  The patient is a pleasant 71 year old man who was found to have multifocal fairly moderate volume, but low risk adenocarcinoma of the prostate.  He was initially and very appropriately placed on a surveillance protocol.  He does have a  strong history of malignancy in his family and surveillance causing him significant anxiety.  Options were further discussed including continued surveillance versus transitioning to a curative intent therapy with surgical extirpation versus ablative  measures and he adamantly wished to proceed with radiation with brachytherapy.  His prostate is less than 50 grams.  He is over 70 and felt to be excellent candidate for this modality.  Informed consent was obtained and placed in medical record.  PROCEDURE IN DETAIL:  The patient being Christian Santos verified, procedure being brachytherapy seed implantation with Space-OAR and cystoscopy was confirmed.  Procedure timeout was performed.  Intravenous antibiotics were administered.  The patient  was placed into a medium lithotomy position.  Sterile field was created, prepped and draped the patient's penis, perineum, and  proximal thighs using iodine.  Transrectal ultrasound probe was introduced after Foley catheter was placed per urethra straight  drain.  Ultrasound images of the prostate were obtained and used for radiation planning as per separate Radiation Oncology note.  They were planning called for 18 catheters delivering 50 to 55 seeds, which were then placed as per the prescribed plan in  anterior to posterior direction using transrectal ultrasound guidance.  Spot fluoroscopic images were revealed excellent placement of the seeds without obvious malpositioning with AP and oblique views.  Attention was directed to Space-OAR implantation.   The transrectal ultrasound probe was taken off of anterior tension and the spacer introducer needle was placed approximately 1 cm anterior to the anterior aspect of the anus transperineally into the anterior perirectal fat of the mid gland and 2 mL of  water was injected in hydrodistention fashion.  This demonstrated excellent placement of the needles in the anterior prerectal space.  Next, 10 mL of the Space-OAR gel matrix was placed and placed across 10 seconds as per manufacturer guidelines.  There  was an excellent posterior displacement of the anterior rectal wall away from the prostate.  Foley catheter was then removed.  Cystourethroscopy was performed using a 16-French flexible cystoscope.  The patient's anterior and posterior urethra was  unremarkable.  Inspection of urinary bladder revealed no diverticula, calcifications, papillary lesions.  There was no evidence of intraluminal brachytherapy seeds.  Retroflexion showed no additional findings.  Cystoscope was removed.  Procedure was  terminated.  The patient tolerated procedure well, no immediate perioperative complications.  The patient was taken to the postanesthesia care unit in stable condition.  Plan for discharge home after void.   MUK D: 07/30/2022 2:39:44 pm T: 07/31/2022 12:25:00 am  JOB:  [id number]/ [id number]  

## 2022-08-20 ENCOUNTER — Telehealth: Payer: Self-pay | Admitting: *Deleted

## 2022-08-20 NOTE — Telephone Encounter (Signed)
CALLED PATIENT TO REMIND OF POST SEED APPTS. FOR 08-21-22, SPOKE WITH PATIENT AND HE IS AWARE OF THESE APPTS.

## 2022-08-21 ENCOUNTER — Ambulatory Visit
Admission: RE | Admit: 2022-08-21 | Discharge: 2022-08-21 | Disposition: A | Discharge status: Home / Self Care | Payer: Medicare Other | Source: Ambulatory Visit | Attending: Urology | Admitting: Urology

## 2022-08-21 ENCOUNTER — Encounter: Payer: Self-pay | Admitting: Urology

## 2022-08-21 ENCOUNTER — Ambulatory Visit
Admission: RE | Admit: 2022-08-21 | Discharge: 2022-08-21 | Disposition: A | Discharge status: Home / Self Care | Payer: Medicare Other | Source: Ambulatory Visit | Attending: Radiation Oncology | Admitting: Radiation Oncology

## 2022-08-21 VITALS — BP 140/70 | HR 55 | Temp 97.8°F | Resp 20 | Ht 66.0 in | Wt 179.0 lb

## 2022-08-21 DIAGNOSIS — C61 Malignant neoplasm of prostate: Secondary | ICD-10-CM | POA: Insufficient documentation

## 2022-08-21 DIAGNOSIS — Z7982 Long term (current) use of aspirin: Secondary | ICD-10-CM | POA: Insufficient documentation

## 2022-08-21 DIAGNOSIS — Z510 Encounter for antineoplastic radiation therapy: Secondary | ICD-10-CM | POA: Diagnosis not present

## 2022-08-21 DIAGNOSIS — Z923 Personal history of irradiation: Secondary | ICD-10-CM | POA: Insufficient documentation

## 2022-08-21 DIAGNOSIS — Z79899 Other long term (current) drug therapy: Secondary | ICD-10-CM | POA: Insufficient documentation

## 2022-08-21 NOTE — Progress Notes (Addendum)
Post-seed nursing appointment for [place] 71yrold [place]/ [place]  I verified patient's identity and began nursing interview. Patient reports dysuria 2/10. No other issues reported at this time.  Meaningful use complete. I-PSS score of 4-mild. Flomax- In patient possession but is NOT taking currently. [facility] MTresa Moore BP (!) 140/70 (BP Location: Right Arm, Patient Position: Sitting, Cuff Size: Large)   Pulse (!) 55   Temp 97.8 F (36.6 C) (Temporal)   Resp 20   Ht '5\' 6"'$  (1.676 m)   Wt 179 lb (81.2 kg)   SpO2 98%   BMI 28.89 kg/m   This concludes the nursing interview.  MLeandra Kern LPN

## 2022-08-21 NOTE — Progress Notes (Signed)
Radiation Oncology         [phone] ________________________________  Name: Christian Santos MRN: [id number]  Date: 08/21/2022  DOB: 10/13/1950  Post-Seed Follow-Up Visit Note  CC: Christian Blase, MD  Christian Frock, MD  Diagnosis:   71 y.o. gentleman with Stage T1c adenocarcinoma of the prostate with Gleason score of 3+3, and PSA of 4.6.     ICD-10-CM   1. Malignant neoplasm of prostate (HCC)  C61       Interval Since Last Radiation:  3 weeks 07/30/22:  Insertion of radioactive I-125 seeds into the prostate gland; 145 Gy, definitive therapy with placement of SpaceOAR gel.  Narrative:  The patient returns today for routine follow-up.  He is complaining of increased urinary frequency and urinary hesitation symptoms. He filled out a questionnaire regarding urinary function today providing and overall IPSS score of 4 characterizing his symptoms as mild.  His pre-implant score was 3. He has mild dysuria at the start of his stream on occasion and mild increased frequency but denies gross hematuria, urgency, straining to void, incomplete emptying or incontinence. He denies any abdominal pain or bowel symptoms. Overall, he is quite pleased with his progress to date.  ALLERGIES:  is allergic to erythromycin base, lisinopril, other, clobetasol, and wool alcohol  [lanolin].  Meds: Current Outpatient Medications  Medication Sig Dispense Refill   acetaminophen (TYLENOL) 650 MG CR tablet Take 650 mg by mouth every 8 (eight) hours as needed for pain.     amLODipine (NORVASC) 5 MG tablet Take 1 tablet (5 mg total) by mouth daily. (Patient taking differently: Take 5 mg by mouth daily.) 90 tablet 3   aspirin EC (ASPIRIN 81) 81 MG tablet Take 81 mg by mouth at bedtime.     Cholecalciferol (VITAMIN D-3 PO) Take by mouth.     ELDERBERRY PO Take by mouth daily.     ezetimibe (ZETIA) 10 MG tablet Take 1 tablet (10 mg total) by mouth daily. (Patient taking differently: Take 10 mg by mouth daily.)  90 tablet 3   Magnesium 500 MG CAPS Take by mouth at bedtime.     melatonin 5 MG TABS Take 5 mg by mouth at bedtime.     metoprolol succinate (TOPROL-XL) 50 MG 24 hr tablet Take 1 tablet by mouth at bedtime.     Multiple Vitamins-Minerals (MULTIVITAMIN WITH MINERALS) tablet Take 1 tablet by mouth daily.     naproxen (NAPROSYN) 500 MG tablet      omeprazole (PRILOSEC) 40 MG capsule Take 1 capsule (40 mg total) by mouth daily. (Patient taking differently: Take 40 mg by mouth daily.) 90 capsule 1   pimecrolimus (ELIDEL) 1 % cream APPLY TO AFFECTED AREAS OF PSORIASIS ONCE OR TWICE DAILY AS NEEDED (Patient taking differently: Apply 1 Application topically 2 (two) times daily as needed. APPLY TO AFFECTED AREAS OF PSORIASIS ONCE OR TWICE DAILY AS NEEDED) 60 g 2   Probiotic Product (PROBIOTIC ADVANCED PO) Take by mouth daily.     tamsulosin (FLOMAX) 0.4 MG CAPS capsule Take 1 capsule (0.4 mg total) by mouth daily as needed. For urinary urgency / weak stream after prostate radiation. 30 capsule 11   traMADol (ULTRAM) 50 MG tablet Take 1-2 tablets (50-100 mg total) by mouth every 6 (six) hours as needed for moderate pain or severe pain (Post-operatively). 15 tablet 0   traZODone (DESYREL) 50 MG tablet Take 50 mg by mouth at bedtime.     No current facility-administered medications for this visit.  Physical Findings: In general this is a well appearing Caucasian male in no acute distress. He's alert and oriented x4 and appropriate throughout the examination. Cardiopulmonary assessment is negative for acute distress and he exhibits normal effort.   Lab Findings: Lab Results  Component Value Date   WBC 6.4 06/02/2017   HGB 15.6 07/30/2022   HCT 46.0 07/30/2022   MCV 91.4 06/02/2017   PLT 294 06/02/2017    Radiographic Findings:  Patient underwent CT imaging in our clinic for post implant dosimetry. The CT will be reviewed by Dr. Tammi Klippel to confirm there is an adequate distribution of radioactive  seeds throughout the prostate gland and ensure that there are no seeds in or near the rectum.  We suspect the final radiation plan and dosimetry will show appropriate coverage of the prostate gland. He understands that we will call and inform him of any unexpected findings on further review of his imaging and dosimetry.  Impression/Plan: 71 y.o. gentleman with Stage T1c adenocarcinoma of the prostate with Gleason score of 3+3, and PSA of 4.6.  The patient is recovering from the effects of radiation. His urinary symptoms should gradually improve over the next 4-6 months. We talked about this today. He is encouraged by his improvement already and is otherwise pleased with his outcome. We also talked about long-term follow-up for prostate cancer following seed implant. He understands that ongoing PSA determinations and digital rectal exams will help perform surveillance to rule out disease recurrence. He has a follow up appointment scheduled with Dr. Tresa Moore on 11/03/22. He understands what to expect with his PSA measures. Patient was also educated today about some of the long-term effects from radiation including a small risk for rectal bleeding and possibly erectile dysfunction. We talked about some of the general management approaches to these potential complications. However, I did encourage the patient to contact our office or return at any point if he has questions or concerns related to his previous radiation and prostate cancer.    Nicholos Johns, PA-C

## 2022-08-21 NOTE — Progress Notes (Signed)
  Radiation Oncology         [phone]) [phone] ________________________________  Name: Christian Santos MRN: [id number]  Date: 08/21/2022  DOB: [birth year]  COMPLEX SIMULATION NOTE  NARRATIVE:  The patient was brought to the [facility] today following prostate seed implantation approximately one month ago.  Identity was confirmed.  All relevant records and images related to the planned course of therapy were reviewed.  Then, the patient was set-up supine.  CT images were obtained.  The CT images were loaded into the planning software.  Then the prostate and rectum were contoured.  Treatment planning then occurred.  The implanted iodine 125 seeds were identified by the physics staff for projection of radiation distribution  I have requested : 3D Simulation  I have requested a DVH of the following structures: Prostate and rectum.    ________________________________  Sheral Apley Tammi Klippel, M.D.

## 2022-09-01 ENCOUNTER — Encounter: Payer: Self-pay | Admitting: Radiation Oncology

## 2022-09-02 DIAGNOSIS — Z923 Personal history of irradiation: Secondary | ICD-10-CM | POA: Diagnosis not present

## 2022-09-02 DIAGNOSIS — Z510 Encounter for antineoplastic radiation therapy: Secondary | ICD-10-CM | POA: Diagnosis not present

## 2022-09-02 DIAGNOSIS — C61 Malignant neoplasm of prostate: Secondary | ICD-10-CM | POA: Diagnosis not present

## 2022-09-02 DIAGNOSIS — Z79899 Other long term (current) drug therapy: Secondary | ICD-10-CM | POA: Diagnosis not present

## 2022-09-02 DIAGNOSIS — Z191 Hormone sensitive malignancy status: Secondary | ICD-10-CM | POA: Diagnosis not present

## 2022-09-02 DIAGNOSIS — Z7982 Long term (current) use of aspirin: Secondary | ICD-10-CM | POA: Diagnosis not present

## 2022-09-02 NOTE — Progress Notes (Signed)
  Radiation Oncology         [phone]) [phone] ________________________________  Name: Christian Santos MRN: [id number]  Date: 09/01/2022  DOB: July 03, 1951  3D Planning Note   Prostate Brachytherapy Post-Implant Dosimetry  Diagnosis: 71 y.o. gentleman with Stage T1c adenocarcinoma of the prostate with Gleason score of 3+3, and PSA of 4.6.  Narrative: On a previous date, Christian Santos returned following prostate seed implantation for post implant planning. He underwent CT scan complex simulation to delineate the three-dimensional structures of the pelvis and demonstrate the radiation distribution.  Since that time, the seed localization, and complex isodose planning with dose volume histograms have now been completed.  Results:   Prostate Coverage - The dose of radiation delivered to the 90% or more of the prostate gland (D90) was 125.21% of the prescription dose. This exceeds our goal of greater than 90%. Rectal Sparing - The volume of rectal tissue receiving the prescription dose or higher was 0.0 cc. This falls under our thresholds tolerance of 1.0 cc.  Impression: The prostate seed implant appears to show adequate target coverage and appropriate rectal sparing.  Plan:  The patient will continue to follow with urology for ongoing PSA determinations. I would anticipate a high likelihood for local tumor control with minimal risk for rectal morbidity.  ________________________________  Sheral Apley Tammi Klippel, M.D.

## 2022-09-16 DIAGNOSIS — H2513 Age-related nuclear cataract, bilateral: Secondary | ICD-10-CM | POA: Diagnosis not present

## 2022-09-22 ENCOUNTER — Other Ambulatory Visit: Payer: BLUE CROSS/BLUE SHIELD

## 2022-09-24 ENCOUNTER — Ambulatory Visit: Payer: BLUE CROSS/BLUE SHIELD | Admitting: Urology

## 2022-10-16 ENCOUNTER — Encounter: Payer: Self-pay | Admitting: *Deleted

## 2022-10-20 ENCOUNTER — Encounter: Payer: Self-pay | Admitting: *Deleted

## 2022-10-27 DIAGNOSIS — C61 Malignant neoplasm of prostate: Secondary | ICD-10-CM | POA: Diagnosis not present

## 2022-10-29 ENCOUNTER — Encounter: Payer: Self-pay | Admitting: *Deleted

## 2022-11-03 DIAGNOSIS — R3915 Urgency of urination: Secondary | ICD-10-CM | POA: Diagnosis not present

## 2022-11-03 DIAGNOSIS — C61 Malignant neoplasm of prostate: Secondary | ICD-10-CM | POA: Diagnosis not present

## 2022-11-03 DIAGNOSIS — N5201 Erectile dysfunction due to arterial insufficiency: Secondary | ICD-10-CM | POA: Diagnosis not present

## 2022-11-11 ENCOUNTER — Inpatient Hospital Stay: Payer: Medicare Other | Attending: Adult Health | Admitting: *Deleted

## 2022-11-11 DIAGNOSIS — C61 Malignant neoplasm of prostate: Secondary | ICD-10-CM

## 2022-11-11 NOTE — Progress Notes (Signed)
2 Identifiers used for verification purposes. No vitals were taken as this was not an in-person visit. Allergies and medications reviewed and updated. Pt denies pain on today. He says he has no fatigue. Pt did say he has occasional hand and feet discomfort due to arthritis. He takes Tylenol 650 mg for the pain. Pt is getting up to bathroom 2-5 times nightly.I did encourage him to limit caffeine and liquid intake before bedtime. Urinary urgency is getting better. Urine flow slowly returning back to near normal.  Pt says he is doing very well. He still works as an Clinical biochemist and stays busy. He has very little burning at the beginning when he voids but he said that is getting better. Pt does have a hx of sleep apnea, but does not wear his CPAP. Pt will visit PCP on Feb.12th for annual physical. Most recent PSA was 0.56 on Jan.22,2024. Next PSA lab due June. Pt will continue to see dermatologist annually. He has a hx of psoriasis. Last colonoscopy was 05/2022, due in 5 years.Vaccines reviewed and updated. Diet and exercise discussed and suggestions made. Reviewed the [employer] with pt and his wife. SCP reviewed and completed.

## 2022-11-14 DIAGNOSIS — M9906 Segmental and somatic dysfunction of lower extremity: Secondary | ICD-10-CM | POA: Diagnosis not present

## 2022-11-14 DIAGNOSIS — M9902 Segmental and somatic dysfunction of thoracic region: Secondary | ICD-10-CM | POA: Diagnosis not present

## 2022-11-14 DIAGNOSIS — M9901 Segmental and somatic dysfunction of cervical region: Secondary | ICD-10-CM | POA: Diagnosis not present

## 2022-11-14 DIAGNOSIS — M9903 Segmental and somatic dysfunction of lumbar region: Secondary | ICD-10-CM | POA: Diagnosis not present

## 2022-11-17 DIAGNOSIS — M9906 Segmental and somatic dysfunction of lower extremity: Secondary | ICD-10-CM | POA: Diagnosis not present

## 2022-11-17 DIAGNOSIS — M9903 Segmental and somatic dysfunction of lumbar region: Secondary | ICD-10-CM | POA: Diagnosis not present

## 2022-11-17 DIAGNOSIS — M9902 Segmental and somatic dysfunction of thoracic region: Secondary | ICD-10-CM | POA: Diagnosis not present

## 2022-11-17 DIAGNOSIS — M9901 Segmental and somatic dysfunction of cervical region: Secondary | ICD-10-CM | POA: Diagnosis not present

## 2022-11-18 DIAGNOSIS — M9902 Segmental and somatic dysfunction of thoracic region: Secondary | ICD-10-CM | POA: Diagnosis not present

## 2022-11-18 DIAGNOSIS — M9906 Segmental and somatic dysfunction of lower extremity: Secondary | ICD-10-CM | POA: Diagnosis not present

## 2022-11-18 DIAGNOSIS — M9903 Segmental and somatic dysfunction of lumbar region: Secondary | ICD-10-CM | POA: Diagnosis not present

## 2022-11-18 DIAGNOSIS — M9901 Segmental and somatic dysfunction of cervical region: Secondary | ICD-10-CM | POA: Diagnosis not present

## 2022-11-20 DIAGNOSIS — M9901 Segmental and somatic dysfunction of cervical region: Secondary | ICD-10-CM | POA: Diagnosis not present

## 2022-11-20 DIAGNOSIS — M9903 Segmental and somatic dysfunction of lumbar region: Secondary | ICD-10-CM | POA: Diagnosis not present

## 2022-11-20 DIAGNOSIS — M9902 Segmental and somatic dysfunction of thoracic region: Secondary | ICD-10-CM | POA: Diagnosis not present

## 2022-11-20 DIAGNOSIS — M9906 Segmental and somatic dysfunction of lower extremity: Secondary | ICD-10-CM | POA: Diagnosis not present

## 2022-11-24 DIAGNOSIS — M9902 Segmental and somatic dysfunction of thoracic region: Secondary | ICD-10-CM | POA: Diagnosis not present

## 2022-11-24 DIAGNOSIS — M9903 Segmental and somatic dysfunction of lumbar region: Secondary | ICD-10-CM | POA: Diagnosis not present

## 2022-11-24 DIAGNOSIS — M9906 Segmental and somatic dysfunction of lower extremity: Secondary | ICD-10-CM | POA: Diagnosis not present

## 2022-11-24 DIAGNOSIS — M9901 Segmental and somatic dysfunction of cervical region: Secondary | ICD-10-CM | POA: Diagnosis not present

## 2022-11-25 DIAGNOSIS — M9902 Segmental and somatic dysfunction of thoracic region: Secondary | ICD-10-CM | POA: Diagnosis not present

## 2022-11-25 DIAGNOSIS — M9906 Segmental and somatic dysfunction of lower extremity: Secondary | ICD-10-CM | POA: Diagnosis not present

## 2022-11-25 DIAGNOSIS — M9901 Segmental and somatic dysfunction of cervical region: Secondary | ICD-10-CM | POA: Diagnosis not present

## 2022-11-25 DIAGNOSIS — M9903 Segmental and somatic dysfunction of lumbar region: Secondary | ICD-10-CM | POA: Diagnosis not present

## 2022-11-28 DIAGNOSIS — M9903 Segmental and somatic dysfunction of lumbar region: Secondary | ICD-10-CM | POA: Diagnosis not present

## 2022-11-28 DIAGNOSIS — M9901 Segmental and somatic dysfunction of cervical region: Secondary | ICD-10-CM | POA: Diagnosis not present

## 2022-11-28 DIAGNOSIS — M9902 Segmental and somatic dysfunction of thoracic region: Secondary | ICD-10-CM | POA: Diagnosis not present

## 2022-11-28 DIAGNOSIS — M9906 Segmental and somatic dysfunction of lower extremity: Secondary | ICD-10-CM | POA: Diagnosis not present

## 2022-12-02 DIAGNOSIS — M9902 Segmental and somatic dysfunction of thoracic region: Secondary | ICD-10-CM | POA: Diagnosis not present

## 2022-12-02 DIAGNOSIS — M9903 Segmental and somatic dysfunction of lumbar region: Secondary | ICD-10-CM | POA: Diagnosis not present

## 2022-12-02 DIAGNOSIS — M9901 Segmental and somatic dysfunction of cervical region: Secondary | ICD-10-CM | POA: Diagnosis not present

## 2022-12-02 DIAGNOSIS — M9906 Segmental and somatic dysfunction of lower extremity: Secondary | ICD-10-CM | POA: Diagnosis not present

## 2022-12-03 DIAGNOSIS — M9903 Segmental and somatic dysfunction of lumbar region: Secondary | ICD-10-CM | POA: Diagnosis not present

## 2022-12-03 DIAGNOSIS — M9901 Segmental and somatic dysfunction of cervical region: Secondary | ICD-10-CM | POA: Diagnosis not present

## 2022-12-03 DIAGNOSIS — M9906 Segmental and somatic dysfunction of lower extremity: Secondary | ICD-10-CM | POA: Diagnosis not present

## 2022-12-03 DIAGNOSIS — M9902 Segmental and somatic dysfunction of thoracic region: Secondary | ICD-10-CM | POA: Diagnosis not present

## 2022-12-04 DIAGNOSIS — M9906 Segmental and somatic dysfunction of lower extremity: Secondary | ICD-10-CM | POA: Diagnosis not present

## 2022-12-04 DIAGNOSIS — M9903 Segmental and somatic dysfunction of lumbar region: Secondary | ICD-10-CM | POA: Diagnosis not present

## 2022-12-04 DIAGNOSIS — M9901 Segmental and somatic dysfunction of cervical region: Secondary | ICD-10-CM | POA: Diagnosis not present

## 2022-12-04 DIAGNOSIS — M9902 Segmental and somatic dysfunction of thoracic region: Secondary | ICD-10-CM | POA: Diagnosis not present

## 2022-12-08 ENCOUNTER — Emergency Department (HOSPITAL_COMMUNITY): Payer: Medicare Other

## 2022-12-08 ENCOUNTER — Emergency Department (HOSPITAL_COMMUNITY)
Admission: EM | Admit: 2022-12-08 | Discharge: 2022-12-08 | Disposition: A | Discharge status: Home / Self Care | Payer: Medicare Other | Attending: Emergency Medicine | Admitting: Emergency Medicine

## 2022-12-08 ENCOUNTER — Other Ambulatory Visit: Payer: Self-pay

## 2022-12-08 ENCOUNTER — Encounter (HOSPITAL_COMMUNITY): Payer: Self-pay

## 2022-12-08 DIAGNOSIS — R0789 Other chest pain: Secondary | ICD-10-CM | POA: Diagnosis not present

## 2022-12-08 DIAGNOSIS — Z79899 Other long term (current) drug therapy: Secondary | ICD-10-CM | POA: Insufficient documentation

## 2022-12-08 DIAGNOSIS — I10 Essential (primary) hypertension: Secondary | ICD-10-CM | POA: Diagnosis not present

## 2022-12-08 DIAGNOSIS — R079 Chest pain, unspecified: Secondary | ICD-10-CM | POA: Diagnosis not present

## 2022-12-08 DIAGNOSIS — Z7982 Long term (current) use of aspirin: Secondary | ICD-10-CM | POA: Diagnosis not present

## 2022-12-08 LAB — CBC
HCT: 43.4 % (ref 39.0–52.0)
Hemoglobin: 16.1 g/dL (ref 13.0–17.0)
MCH: 32.3 pg (ref 26.0–34.0)
MCHC: 37.1 g/dL — ABNORMAL HIGH (ref 30.0–36.0)
MCV: 87.1 fL (ref 80.0–100.0)
Platelets: 292 10*3/uL (ref 150–400)
RBC: 4.98 MIL/uL (ref 4.22–5.81)
RDW: 12.1 % (ref 11.5–15.5)
WBC: 6.7 10*3/uL (ref 4.0–10.5)
nRBC: 0 % (ref 0.0–0.2)

## 2022-12-08 LAB — BASIC METABOLIC PANEL
Anion gap: 10 (ref 5–15)
BUN: 9 mg/dL (ref 8–23)
CO2: 22 mmol/L (ref 22–32)
Calcium: 8.8 mg/dL — ABNORMAL LOW (ref 8.9–10.3)
Chloride: 107 mmol/L (ref 98–111)
Creatinine, Ser: 0.87 mg/dL (ref 0.61–1.24)
GFR, Estimated: >60 mL/min (ref >60)
Glucose, Bld: 101 mg/dL — ABNORMAL HIGH (ref 70–99)
Potassium: 3.4 mmol/L — ABNORMAL LOW (ref 3.5–5.1)
Sodium: 139 mmol/L (ref 135–145)

## 2022-12-08 LAB — TROPONIN I (HIGH SENSITIVITY)
Troponin I (High Sensitivity): 13 ng/L (ref <18)
Troponin I (High Sensitivity): 13 ng/L (ref <18)

## 2022-12-08 MED ORDER — SODIUM CHLORIDE 0.9 % IV BOLUS
1000.0000 mL | Freq: Once | INTRAVENOUS | Status: AC
  Administered 2022-12-08: 1000 mL via INTRAVENOUS

## 2022-12-08 MED ORDER — FAMOTIDINE IN NACL 20-0.9 MG/50ML-% IV SOLN
20.0000 mg | Freq: Once | INTRAVENOUS | Status: AC
  Administered 2022-12-08: 20 mg via INTRAVENOUS
  Filled 2022-12-08: qty 50

## 2022-12-08 MED ORDER — NITROGLYCERIN 0.4 MG SL SUBL: 0.4000 mg | SUBLINGUAL_TABLET | SUBLINGUAL | Status: DC | PRN

## 2022-12-08 MED ORDER — ASPIRIN 325 MG PO TABS
325.0000 mg | ORAL_TABLET | Freq: Every day | ORAL | Status: DC
  Administered 2022-12-08: 325 mg via ORAL
  Filled 2022-12-08: qty 1

## 2022-12-08 NOTE — Discharge Instructions (Addendum)
You were evaluated today for chest pain.  Your workup was reassuring for no signs of acute coronary syndrome at this time.  I have placed a referral for cardiology for follow-up.  Please follow-up with their office for further evaluation and management.  If your chest pain suddenly worsens or you develop shortness of breath or other life-threatening symptoms please return to the emergency department.

## 2022-12-08 NOTE — ED Provider Notes (Signed)
[facility] Provider Note   CSN: [id number] Arrival date & time: 12/08/22  1516     History  Chief Complaint  Patient presents with   Chest Pain    Christian Santos is a 72 y.o. male.  Patient presents emergency department complaining of substernal chest pain/burning which began this morning at approximate 1030.  The patient states he woke up and felt hot and sweaty.  He states he went to work continue to feel unwell.  He went home and his wife checked his blood pressure and noticed he was hypertensive with a blood pressure of approximately 170/108.  His heart rate at the time was 107.  He went to his primary care who recommended that he come to the emergency department for further evaluation.  The patient denies shortness of breath, nausea, vomiting, abdominal pain.  He endorses chest pain described as a burning in the sternal region.  He rated the initial pain as 4 out of 10 in severity, he states it is currently lessening.  Past medical history significant for hypertension, degenerative disc disease, GERD, OA, coronary artery calcification seen on CT scan  HPI     Home Medications Prior to Admission medications   Medication Sig Start Date End Date Taking? Authorizing Provider  acetaminophen (TYLENOL) 650 MG CR tablet Take 650 mg by mouth every 8 (eight) hours as needed for pain.   Yes [provider]  amLODipine (NORVASC) 5 MG tablet Take 1 tablet (5 mg total) by mouth daily. 04/29/21  Yes Myles Gip, DO  aspirin EC (ASPIRIN 81) 81 MG tablet Take 81 mg by mouth daily. 01/04/22  Yes [provider]  Cholecalciferol (VITAMIN D-3 PO) Take 1 capsule by mouth daily.   Yes [provider]  doxylamine, Sleep, (UNISOM) 25 MG tablet Take 50 mg by mouth at bedtime as needed for sleep.   Yes [provider]  ELDERBERRY PO Take 1 each by mouth daily. Elderberry gummy   Yes [provider]  ezetimibe  (ZETIA) 10 MG tablet Take 1 tablet (10 mg total) by mouth daily. 05/06/21  Yes Myles Gip, DO  MAGNESIUM OXIDE PO Take 1 tablet by mouth at bedtime.   Yes [provider]  melatonin 5 MG TABS Take 5 mg by mouth at bedtime.   Yes [provider]  metoprolol succinate (TOPROL-XL) 50 MG 24 hr tablet Take 1 tablet by mouth at bedtime. 09/01/20  Yes [provider]  montelukast (SINGULAIR) 10 MG tablet Take 10 mg by mouth at bedtime. 10/10/22  Yes [provider]  Multiple Vitamins-Minerals (MULTIVITAMIN WITH MINERALS) tablet Take 1 tablet by mouth daily.   Yes [provider]  pimecrolimus (ELIDEL) 1 % cream APPLY TO AFFECTED AREAS OF PSORIASIS ONCE OR TWICE DAILY AS NEEDED Patient taking differently: Apply 1 Application topically 2 (two) times daily as needed (psoriasis). 06/26/21  Yes Ralene Bathe, MD  Probiotic Product (PROBIOTIC ADVANCED PO) Take 1 capsule by mouth daily.   Yes [provider]  sildenafil (VIAGRA) 100 MG tablet Take 100 mg by mouth as needed for erectile dysfunction. 06/02/22  Yes [provider]  tamsulosin (FLOMAX) 0.4 MG CAPS capsule Take 1 capsule (0.4 mg total) by mouth daily as needed. For urinary urgency / weak stream after prostate radiation. Patient taking differently: Take 0.4 mg by mouth daily. For urinary urgency / weak stream after prostate radiation. 07/30/22  Yes Alexis Frock, MD  traZODone (  DESYREL) 50 MG tablet Take 50 mg by mouth at bedtime.   Yes [provider]  omeprazole (PRILOSEC) 40 MG capsule Take 1 capsule (40 mg total) by mouth daily. 04/29/21   Myles Gip, DO      Allergies    Erythromycin base, Other, Zestril [lisinopril], Clobetasol, and Wool alcohol  [lanolin]    Review of Systems   Review of Systems  Respiratory:  Negative for shortness of breath.   Cardiovascular:  Positive for chest pain. Negative for palpitations.  Gastrointestinal:  Negative for abdominal  pain, nausea and vomiting.    Physical Exam Updated Vital Signs BP (!) 163/64   Pulse 73   Temp 98.1 F (36.7 C)   Resp 20   Ht '5\' 6"'$  (1.676 m)   Wt 74.8 kg   SpO2 94%   BMI 26.63 kg/m  Physical Exam Vitals and nursing note reviewed.  Constitutional:      General: He is not in acute distress.    Appearance: He is well-developed.  HENT:     Head: Normocephalic and atraumatic.  Eyes:     Conjunctiva/sclera: Conjunctivae normal.  Cardiovascular:     Rate and Rhythm: Normal rate and regular rhythm.     Heart sounds: No murmur heard. Pulmonary:     Effort: Pulmonary effort is normal. No respiratory distress.     Breath sounds: Normal breath sounds.  Abdominal:     Palpations: Abdomen is soft.     Tenderness: There is no abdominal tenderness.  Musculoskeletal:        General: No swelling.     Cervical back: Neck supple.  Skin:    General: Skin is warm and dry.     Capillary Refill: Capillary refill takes less than 2 seconds.  Neurological:     Mental Status: He is alert.  Psychiatric:        Mood and Affect: Mood normal.     ED Results / Procedures / Treatments   Labs (all labs ordered are listed, but only abnormal results are displayed) Labs Reviewed  CBC - Abnormal; Notable for the following components:      Result Value   MCHC 37.1 (*)    All other components within normal limits  BASIC METABOLIC PANEL - Abnormal; Notable for the following components:   Potassium 3.4 (*)    Glucose, Bld 101 (*)    Calcium 8.8 (*)    All other components within normal limits  TROPONIN I (HIGH SENSITIVITY)  TROPONIN I (HIGH SENSITIVITY)  TROPONIN I (HIGH SENSITIVITY)    EKG EKG Interpretation  Date/Time:  Monday December 08 2022 15:10:14 EST Ventricular Rate:  119 PR Interval:  170 QRS Duration: 90 QT Interval:  332 QTC Calculation: 467 R Axis:   -60 Text Interpretation: Sinus tachycardia Left anterior fascicular block Minimal voltage criteria for LVH, may be normal  variant ( R in aVL ) Possible Anterior infarct , age undetermined Abnormal ECG Mild lateral ST depression When compared with ECG of 25-Dec-2021 12:12, PREVIOUS ECG IS PRESENT Confirmed by Georgina Snell [phone]) on 12/08/2022 4:35:58 PM  Radiology DG Chest 2 View  Result Date: 12/08/2022 CLINICAL DATA:  Chest pain EXAM: CHEST - 2 VIEW COMPARISON:  CXR 12/25/21 FINDINGS: No pleural effusion. No pneumothorax. Normal cardiac and mediastinal contours. No focal airspace opacity. No radiographically apparent displaced rib fractures. Vertebral body heights are maintained. IMPRESSION: No focal airspace opacity. Electronically Signed   By: Marin Roberts M.D.   On: 12/08/2022 16:33  Procedures Procedures    Medications Ordered in ED Medications  aspirin tablet 325 mg (325 mg Oral Given 12/08/22 1641)  sodium chloride 0.9 % bolus 1,000 mL (0 mLs Intravenous Stopped 12/08/22 1843)  famotidine (PEPCID) IVPB 20 mg premix (0 mg Intravenous Stopped 12/08/22 2055)    ED Course/ Medical Decision Making/ A&P                             Medical Decision Making Risk OTC drugs. Prescription drug management.   This patient presents to the ED for concern of chest pain, this involves an extensive number of treatment options, and is a complaint that carries with it a high risk of complications and morbidity.  The differential diagnosis includes ACS, GERD, pneumonia, PE, dissection, musculoskeletal pain, others   Co morbidities that complicate the patient evaluation  History GERD   Additional history obtained:  Additional history obtained from patient's wife   Lab Tests:  I Ordered, and personally interpreted labs.  The pertinent results include: Initial and repeat troponin 13, grossly unremarkable BMP and CBC   Imaging Studies ordered:  I ordered imaging studies including chest x-ray I independently visualized and interpreted imaging which showed no active disease I agree with the radiologist  interpretation   Cardiac Monitoring: / EKG:  The patient was maintained on a cardiac monitor.  I personally viewed and interpreted the cardiac monitored which showed an underlying rhythm of: Sinus tachycardia   Problem List / ED Course / Critical interventions / Medication management   I ordered medication including aspirin for antiplatelet coverage, Pepcid for reflux-like symptoms Reevaluation of the patient after these medicines showed that the patient improved I have reviewed the patients home medicines and have made adjustments as needed    Test / Admission - Considered:  Negative troponins x 2, nonischemic EKG.  Very low clinical suspicion at this time of ACS.  No shortness of breath to suggest pulmonary embolism.  Patient was initially tachycardic but his heart rate normalized after settling in the room.  No pneumonia on chest x-ray.  No clinical signs of dissection.  Consider GERD versus atypical chest pain at this time.  Plan to have patient follow-up as outpatient with cardiology.  Patient voices understanding with this plan.  Discharge instructions provided including return precautions which would include worsening chest pain, shortness of breath        Final Clinical Impression(s) / ED Diagnoses Final diagnoses:  Chest pain, unspecified type    Rx / DC Orders ED Discharge Orders          Ordered    Ambulatory referral to Cardiology       Comments: If you have not heard from the Cardiology office within the next 72 hours please call [phone].   12/08/22 2202              Dorothyann Peng, PA-C 12/08/22 2203    Elgie Congo, MD 12/08/22 2255

## 2022-12-08 NOTE — ED Triage Notes (Signed)
Pt reports tightness and burning in his chest since 10am this morning, went to PCP and sent here for further eval. Pt denies sob, nv, dizziness.

## 2022-12-09 DIAGNOSIS — M9903 Segmental and somatic dysfunction of lumbar region: Secondary | ICD-10-CM | POA: Diagnosis not present

## 2022-12-09 DIAGNOSIS — M9901 Segmental and somatic dysfunction of cervical region: Secondary | ICD-10-CM | POA: Diagnosis not present

## 2022-12-09 DIAGNOSIS — M9902 Segmental and somatic dysfunction of thoracic region: Secondary | ICD-10-CM | POA: Diagnosis not present

## 2022-12-09 DIAGNOSIS — M9906 Segmental and somatic dysfunction of lower extremity: Secondary | ICD-10-CM | POA: Diagnosis not present

## 2022-12-10 DIAGNOSIS — M9902 Segmental and somatic dysfunction of thoracic region: Secondary | ICD-10-CM | POA: Diagnosis not present

## 2022-12-10 DIAGNOSIS — M9906 Segmental and somatic dysfunction of lower extremity: Secondary | ICD-10-CM | POA: Diagnosis not present

## 2022-12-10 DIAGNOSIS — M9903 Segmental and somatic dysfunction of lumbar region: Secondary | ICD-10-CM | POA: Diagnosis not present

## 2022-12-10 DIAGNOSIS — M9901 Segmental and somatic dysfunction of cervical region: Secondary | ICD-10-CM | POA: Diagnosis not present

## 2022-12-11 DIAGNOSIS — C61 Malignant neoplasm of prostate: Secondary | ICD-10-CM | POA: Diagnosis not present

## 2022-12-11 DIAGNOSIS — M9906 Segmental and somatic dysfunction of lower extremity: Secondary | ICD-10-CM | POA: Diagnosis not present

## 2022-12-11 DIAGNOSIS — M9903 Segmental and somatic dysfunction of lumbar region: Secondary | ICD-10-CM | POA: Diagnosis not present

## 2022-12-11 DIAGNOSIS — M9902 Segmental and somatic dysfunction of thoracic region: Secondary | ICD-10-CM | POA: Diagnosis not present

## 2022-12-11 DIAGNOSIS — M9901 Segmental and somatic dysfunction of cervical region: Secondary | ICD-10-CM | POA: Diagnosis not present

## 2022-12-15 DIAGNOSIS — M9902 Segmental and somatic dysfunction of thoracic region: Secondary | ICD-10-CM | POA: Diagnosis not present

## 2022-12-15 DIAGNOSIS — M9903 Segmental and somatic dysfunction of lumbar region: Secondary | ICD-10-CM | POA: Diagnosis not present

## 2022-12-15 DIAGNOSIS — M9901 Segmental and somatic dysfunction of cervical region: Secondary | ICD-10-CM | POA: Diagnosis not present

## 2022-12-15 DIAGNOSIS — M9906 Segmental and somatic dysfunction of lower extremity: Secondary | ICD-10-CM | POA: Diagnosis not present

## 2022-12-18 DIAGNOSIS — M9901 Segmental and somatic dysfunction of cervical region: Secondary | ICD-10-CM | POA: Diagnosis not present

## 2022-12-18 DIAGNOSIS — M9902 Segmental and somatic dysfunction of thoracic region: Secondary | ICD-10-CM | POA: Diagnosis not present

## 2022-12-18 DIAGNOSIS — M9906 Segmental and somatic dysfunction of lower extremity: Secondary | ICD-10-CM | POA: Diagnosis not present

## 2022-12-18 DIAGNOSIS — M9903 Segmental and somatic dysfunction of lumbar region: Secondary | ICD-10-CM | POA: Diagnosis not present

## 2022-12-22 DIAGNOSIS — M9903 Segmental and somatic dysfunction of lumbar region: Secondary | ICD-10-CM | POA: Diagnosis not present

## 2022-12-22 DIAGNOSIS — M9906 Segmental and somatic dysfunction of lower extremity: Secondary | ICD-10-CM | POA: Diagnosis not present

## 2022-12-22 DIAGNOSIS — M9902 Segmental and somatic dysfunction of thoracic region: Secondary | ICD-10-CM | POA: Diagnosis not present

## 2022-12-22 DIAGNOSIS — M9901 Segmental and somatic dysfunction of cervical region: Secondary | ICD-10-CM | POA: Diagnosis not present

## 2023-01-05 ENCOUNTER — Ambulatory Visit: Payer: Medicare Other | Admitting: Dermatology

## 2023-01-20 ENCOUNTER — Ambulatory Visit: Payer: BC Managed Care – PPO | Admitting: Cardiovascular Disease

## 2023-01-21 ENCOUNTER — Ambulatory Visit (INDEPENDENT_AMBULATORY_CARE_PROVIDER_SITE_OTHER): Payer: Medicare Other | Admitting: Dermatology

## 2023-01-21 VITALS — BP 136/89 | HR 89

## 2023-01-21 DIAGNOSIS — L720 Epidermal cyst: Secondary | ICD-10-CM

## 2023-01-21 DIAGNOSIS — L578 Other skin changes due to chronic exposure to nonionizing radiation: Secondary | ICD-10-CM

## 2023-01-21 DIAGNOSIS — Z86018 Personal history of other benign neoplasm: Secondary | ICD-10-CM

## 2023-01-21 DIAGNOSIS — Z1283 Encounter for screening for malignant neoplasm of skin: Secondary | ICD-10-CM | POA: Diagnosis not present

## 2023-01-21 DIAGNOSIS — Z85828 Personal history of other malignant neoplasm of skin: Secondary | ICD-10-CM | POA: Diagnosis not present

## 2023-01-21 DIAGNOSIS — D229 Melanocytic nevi, unspecified: Secondary | ICD-10-CM

## 2023-01-21 DIAGNOSIS — L821 Other seborrheic keratosis: Secondary | ICD-10-CM

## 2023-01-21 DIAGNOSIS — L729 Follicular cyst of the skin and subcutaneous tissue, unspecified: Secondary | ICD-10-CM

## 2023-01-21 DIAGNOSIS — Z79899 Other long term (current) drug therapy: Secondary | ICD-10-CM | POA: Diagnosis not present

## 2023-01-21 DIAGNOSIS — L814 Other melanin hyperpigmentation: Secondary | ICD-10-CM

## 2023-01-21 DIAGNOSIS — L304 Erythema intertrigo: Secondary | ICD-10-CM | POA: Diagnosis not present

## 2023-01-21 MED ORDER — KETOCONAZOLE 2 % EX CREA: TOPICAL_CREAM | CUTANEOUS | Status: DC

## 2023-01-21 NOTE — Patient Instructions (Addendum)
Start Ketoconazole cream apply to groin Tues, Thurs, sat at bedtime  Start Hydrocortisone cream apply to groin Tues, Thurs, sat at bedtime   Apply over the counter Zeasorb Af powder daily to groin     Due to recent changes in healthcare laws, you may see results of your pathology and/or laboratory studies on MyChart before the doctors have had a chance to review them. We understand that in some cases there may be results that are confusing or concerning to you. Please understand that not all results are received at the same time and often the doctors may need to interpret multiple results in order to provide you with the best plan of care or course of treatment. Therefore, we ask that you please give [place] 2 business days to thoroughly review all your results before contacting the office for clarification. Should we see a critical lab result, you will be contacted sooner.   If You Need Anything After Your Visit  If you have any questions or concerns for your doctor, please call our main line at [phone] and press option 4 to reach your doctor's medical assistant. If no one answers, please leave a voicemail as directed and we will return your call as soon as possible. Messages left after 4 pm will be answered the following business day.   You may also send [place] a message via MyChart. We typically respond to MyChart messages within 1-2 business days.  For prescription refills, please ask your pharmacy to contact our office. Our fax number is [phone].  If you have an urgent issue when the clinic is closed that cannot wait until the next business day, you can page your doctor at the number below.    Please note that while we do our best to be available for urgent issues outside of office hours, we are not available 24/7.   If you have an urgent issue and are unable to reach [place], you may choose to seek medical care at your doctor's office, retail clinic, urgent care center, or emergency  room.  If you have a medical emergency, please immediately call 911 or go to the emergency department.  Pager Numbers  - Dr. Gwen Pounds: [phone]  - Dr. Neale Burly: [phone]  - Dr. Roseanne Reno: [phone]  In the event of inclement weather, please call our main line at [phone] for an update on the status of any delays or closures.  Dermatology Medication Tips: Please keep the boxes that topical medications come in in order to help keep track of the instructions about where and how to use these. Pharmacies typically print the medication instructions only on the boxes and not directly on the medication tubes.   If your medication is too expensive, please contact our office at [phone] option 4 or send [place] a message through MyChart.   We are unable to tell what your co-pay for medications will be in advance as this is different depending on your insurance coverage. However, we may be able to find a substitute medication at lower cost or fill out paperwork to get insurance to cover a needed medication.   If a prior authorization is required to get your medication covered by your insurance company, please allow [place] 1-2 business days to complete this process.  Drug prices often vary depending on where the prescription is filled and some pharmacies may offer cheaper prices.  The website www.goodrx.com contains coupons for medications through different pharmacies. The prices here do not account for what the cost  may be with help from insurance (it may be cheaper with your insurance), but the website can give you the price if you did not use any insurance.  - You can print the associated coupon and take it with your prescription to the pharmacy.  - You may also stop by our office during regular business hours and pick up a GoodRx coupon card.  - If you need your prescription sent electronically to a different pharmacy, notify our office through [facility] or by phone at [phone]  option 4.     Si Usted Necesita Algo Despus de Su Visita  Tambin puede enviarnos un mensaje a travs de Pharmacist, community. Por lo general respondemos a los mensajes de MyChart en el transcurso de 1 a 2 das hbiles.  Para renovar recetas, por favor pida a su farmacia que se ponga en contacto con nuestra oficina. Harland Dingwall de fax es [place] [phone].  Si tiene un asunto urgente cuando la clnica est cerrada y que no puede esperar hasta el siguiente da hbil, puede llamar/localizar a su doctor(a) al nmero que aparece a continuacin.   Por favor, tenga en cuenta que aunque hacemos todo lo posible para estar disponibles para asuntos urgentes fuera del horario de [place], no estamos disponibles las 24 horas del da, los 7 das de la [place].   Si tiene un problema urgente y no puede comunicarse con nosotros, puede optar por buscar atencin mdica  en el consultorio de su doctor(a), en una clnica privada, en un centro de atencin urgente o en una sala de emergencias.  Si tiene Engineering geologist, por favor llame inmediatamente al 911 o vaya a la sala de emergencias.  Nmeros de bper  - Dr. Nehemiah Massed: [phone]  - Dra. Moye: [phone]  - Dra. Nicole Kindred: [phone]  En caso de inclemencias del [place], por favor llame a Johnsie Kindred principal al [phone] para una actualizacin sobre el [place] de cualquier retraso o cierre.  Consejos para la medicacin en dermatologa: Por favor, guarde las cajas en las que vienen los medicamentos de uso tpico para ayudarle a seguir las instrucciones sobre dnde y cmo usarlos. Las farmacias generalmente imprimen las instrucciones del medicamento slo en las cajas y no directamente en los tubos del [place].   Si su medicamento es muy caro, por favor, pngase en contacto con Zigmund Daniel llamando al [phone] y presione la opcin 4 o envenos un mensaje a travs de Pharmacist, community.   No podemos decirle cul ser su copago por los medicamentos  por adelantado ya que esto es diferente dependiendo de la cobertura de su seguro. Sin embargo, es posible que podamos encontrar un medicamento sustituto a Electrical engineer un formulario para que el seguro cubra el medicamento que se considera necesario.   Si se requiere una autorizacin previa para que su compaa de seguros [place] su medicamento, por favor permtanos de 1 a 2 das hbiles para completar este proceso.  Los precios de los medicamentos varan con frecuencia dependiendo del Environmental consultant de dnde se surte la receta y alguna farmacias pueden ofrecer precios ms baratos.  El sitio web www.goodrx.com tiene cupones para medicamentos de Airline pilot. Los precios aqu no tienen en cuenta lo que podra costar con la ayuda del seguro (puede ser ms barato con su seguro), pero el sitio web puede darle el precio si no utiliz Research scientist (physical sciences).  - Puede imprimir el cupn correspondiente y llevarlo con su receta a la farmacia.  - Tambin puede pasar por Cleotis Nipper  oficina durante el horario de atencin regular y Charity fundraiser una tarjeta de cupones de GoodRx.  - Si necesita que su receta se enve electrnicamente a una farmacia diferente, informe a nuestra oficina a travs de MyChart de [place] o por telfono llamando al [phone] y presione la opcin 4.

## 2023-01-21 NOTE — Progress Notes (Signed)
Follow-Up Visit   Subjective  Christian Santos is a 72 y.o. male who presents for the following: Skin Cancer Screening and Full Body Skin Exam, hx of BCC, hx of Dysplastic nevus.   The patient presents for Total-Body Skin Exam (TBSE) for skin cancer screening and mole check. The patient has spots, moles and lesions to be evaluated, some may be new or changing and the patient has concerns that these could be cancer.    The following portions of the chart were reviewed this encounter and updated as appropriate: medications, allergies, medical history  Review of Systems:  No other skin or systemic complaints except as noted in HPI or Assessment and Plan.  Objective  Well appearing patient in no apparent distress; mood and affect are within normal limits.  A full examination was performed including scalp, head, eyes, ears, nose, lips, neck, chest, axillae, abdomen, back, buttocks, bilateral upper extremities, bilateral lower extremities, hands, feet, fingers, toes, fingernails, and toenails. All findings within normal limits unless otherwise noted below.   Relevant physical exam findings are noted in the Assessment and Plan.    Assessment & Plan   LENTIGINES, SEBORRHEIC KERATOSES, HEMANGIOMAS - Benign normal skin lesions - Benign-appearing - Call for any changes  MELANOCYTIC NEVI - Tan-brown and/or pink-flesh-colored symmetric macules and papules - Benign appearing on exam today - Observation - Call clinic for new or changing moles - Recommend daily use of broad spectrum spf 30+ sunscreen to sun-exposed areas.   ACTINIC DAMAGE - Chronic condition, secondary to cumulative UV/sun exposure - diffuse scaly erythematous macules with underlying dyspigmentation - Recommend daily broad spectrum sunscreen SPF 30+ to sun-exposed areas, reapply every 2 hours as needed.  - Staying in the shade or wearing long sleeves, sun glasses (UVA+UVB protection) and wide brim hats (4-inch brim  around the entire circumference of the hat) are also recommended for sun protection.  - Call for new or changing lesions.  EPIDERMAL INCLUSION CYST Exam: Subcutaneous nodule at right cheek   Benign-appearing. Exam most consistent with an epidermal inclusion cyst. Discussed that a cyst is a benign growth that can grow over time and sometimes get irritated or inflamed. Recommend observation if it is not bothersome. Discussed option of surgical excision to remove it if it is growing, symptomatic, or other changes noted. Please call for new or changing lesions so they can be evaluated.  NEVUS vs Fibrous papule  Flesh papule at the right nose  Benign-appearing.  Observation.  Call clinic for new or changing moles.  Recommend daily use of broad spectrum spf 30+ sunscreen to sun-exposed areas.    NEVUS vs SKIN TAG 2 Flesh papules Left buttock  Benign-appearing.  Observation.  Call clinic for new or changing lesions.  Recommend daily use of broad spectrum spf 30+ sunscreen to sun-exposed areas.    INTERTRIGO Exam: Erythematous macerated patches Location: groin  Chronic and persistent condition with duration or expected duration over one year. Condition is symptomatic/ bothersome to patient. Not currently at goal.   Intertrigo is a chronic recurrent rash that occurs in skin fold areas that may be associated with friction; heat; moisture; yeast; fungus; and bacteria.  It is exacerbated by increased movement / activity; sweating; and higher atmospheric temperature.  Treatment Plan Start Ketoconazole cream apply to groin Tues, Thurs, sat at bedtime  Start otc Zeasorb AF powder daily   Patient could possibly have allergies to steroid creams so we will avoid adding Hydrocortisone cream   Psoriasis with nail dystrophy  vs Trauma  Left great toe  No treatment needed   HISTORY OF DYSPLASTIC NEVUS Left posterior shoulder  No evidence of recurrence today Recommend regular full body skin  exams Recommend daily broad spectrum sunscreen SPF 30+ to sun-exposed areas, reapply every 2 hours as needed.  Call if any new or changing lesions are noted between office visits   HISTORY OF BASAL CELL CARCINOMA OF THE SKIN Right temple/hairline  - No evidence of recurrence today - Recommend regular full body skin exams - Recommend daily broad spectrum sunscreen SPF 30+ to sun-exposed areas, reapply every 2 hours as needed.  - Call if any new or changing lesions are noted between office visits   SKIN CANCER SCREENING PERFORMED TODAY.  Return in about 1 year (around 01/21/2024) for TBSE, hx of BCC, hx of Dysplastic nevus .  IAngelique Holm, CMA, am acting as scribe for Armida Sans, MD .   Documentation: I have reviewed the above documentation for accuracy and completeness, and I agree with the above.  Armida Sans, MD

## 2023-02-02 ENCOUNTER — Encounter (HOSPITAL_COMMUNITY): Payer: BLUE CROSS/BLUE SHIELD

## 2023-02-02 ENCOUNTER — Encounter: Payer: Self-pay | Admitting: Dermatology

## 2023-02-03 ENCOUNTER — Other Ambulatory Visit (HOSPITAL_COMMUNITY): Payer: Self-pay | Admitting: Family Medicine

## 2023-02-03 ENCOUNTER — Ambulatory Visit (HOSPITAL_COMMUNITY)
Admission: RE | Admit: 2023-02-03 | Discharge: 2023-02-03 | Disposition: A | Discharge status: Home / Self Care | Payer: Medicare Other | Source: Ambulatory Visit | Attending: Vascular Surgery | Admitting: Vascular Surgery

## 2023-02-03 DIAGNOSIS — I779 Disorder of arteries and arterioles, unspecified: Secondary | ICD-10-CM | POA: Diagnosis not present

## 2023-03-23 ENCOUNTER — Other Ambulatory Visit: Payer: BLUE CROSS/BLUE SHIELD

## 2023-03-25 ENCOUNTER — Ambulatory Visit: Payer: BLUE CROSS/BLUE SHIELD | Admitting: Urology

## 2023-04-27 DIAGNOSIS — C61 Malignant neoplasm of prostate: Secondary | ICD-10-CM | POA: Diagnosis not present

## 2023-05-11 DIAGNOSIS — N5201 Erectile dysfunction due to arterial insufficiency: Secondary | ICD-10-CM | POA: Diagnosis not present

## 2023-05-11 DIAGNOSIS — R3915 Urgency of urination: Secondary | ICD-10-CM | POA: Diagnosis not present

## 2023-05-11 DIAGNOSIS — C61 Malignant neoplasm of prostate: Secondary | ICD-10-CM | POA: Diagnosis not present

## 2023-09-01 IMAGING — CT CT CARDIAC CORONARY ARTERY CALCIUM SCORE
3 series · 14 of 20 positions shown, 16 images · non-contrast
Comparison: Chest CT 11/13/2016.
COMPARISON: Chest CT 11/13/2016.

Addendum:
EXAM:
OVER-READ INTERPRETATION  CT CHEST

The following report is an over-read performed by radiologist Dr.
Tripti Tiger [REDACTED] on 09/10/2021. This
over-read does not include interpretation of cardiac or coronary
anatomy or pathology. The coronary calcium score interpretation by
the cardiologist is attached.
CLINICAL DATA: 70M for cardiovascular disease risk stratification
Coronary Calcium Score
TECHNIQUE: A gated, non-contrast computed tomography scan of the heart was
performed using 3mm slice thickness. Axial images were analyzed on a
dedicated workstation. Calcium scoring of the coronary arteries was
performed using the Agatston method.

[Series 2: cascseq 2.0 sa36 70% (id) · axial · 0.39mm/px · z∈[-283,-193]mm · 4 of 76 slices shown]
[im 16/76  vessel]
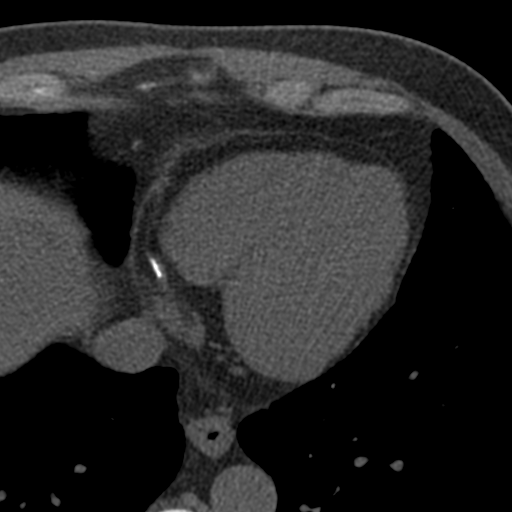
[im 31/76  vessel]
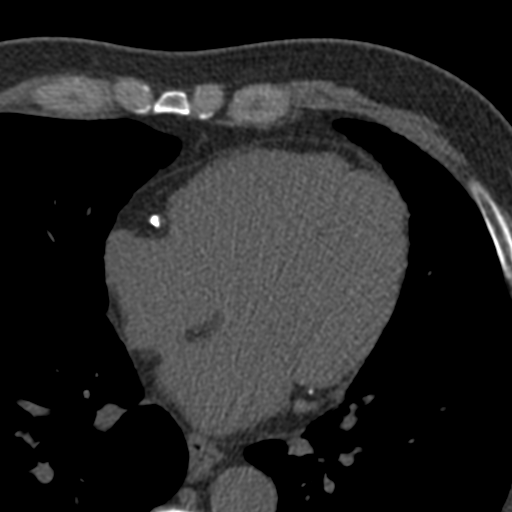
[im 46/76  vessel]
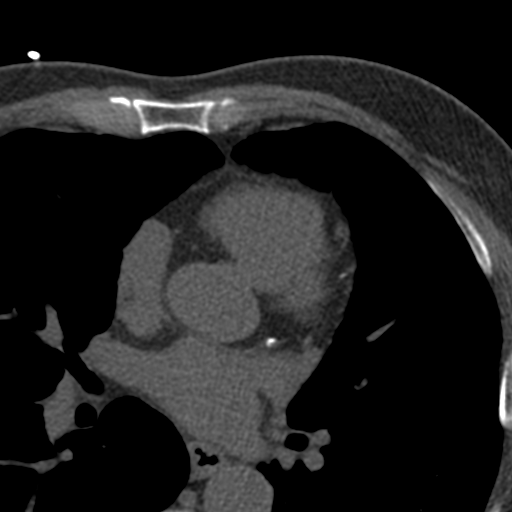
[im 61/76  vessel]
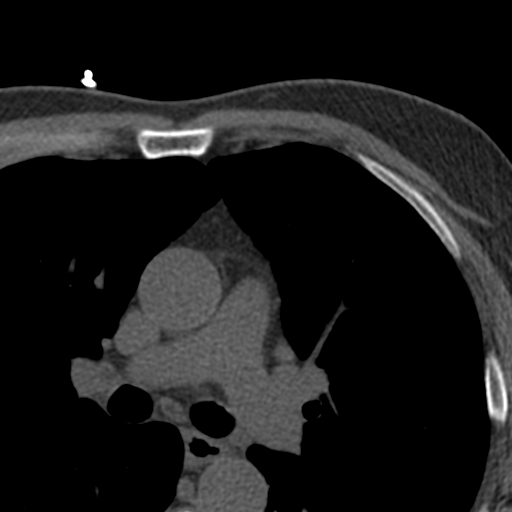

[Series 3: cascseq 2.0 bf37 st · axial · 0.76mm/px · z∈[-289,-189]mm · 5 of 76 slices shown, 7 images]
[im 13/76  vessel]
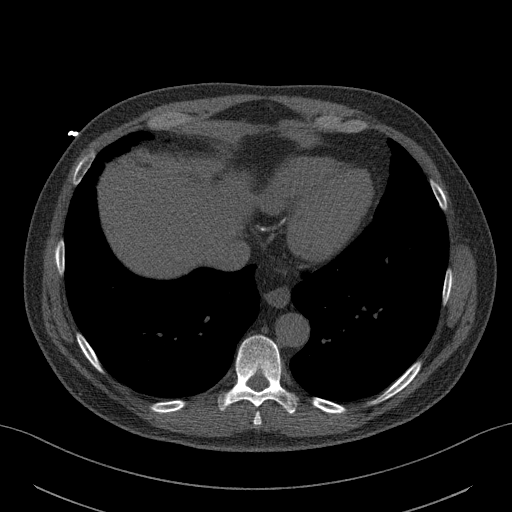
[im 13/76  lung]
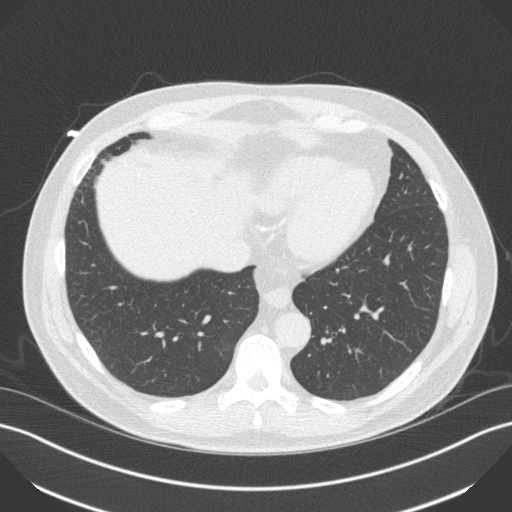
[im 26/76  vessel]
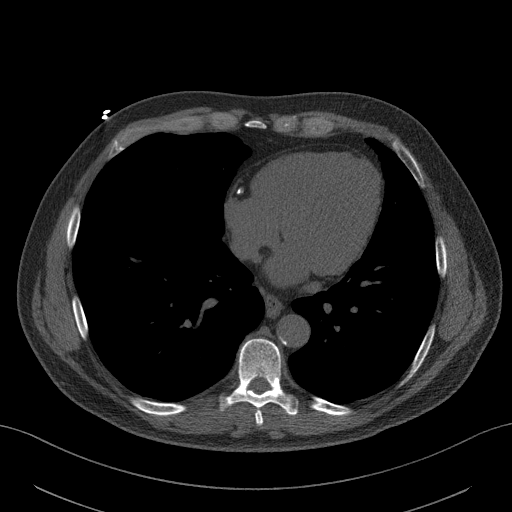
[im 38/76  vessel]
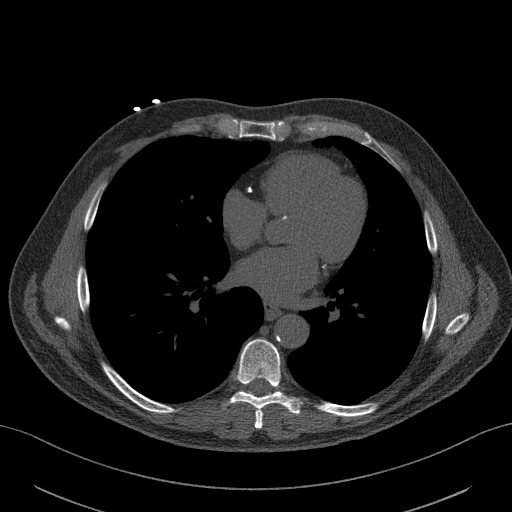
[im 51/76  vessel]
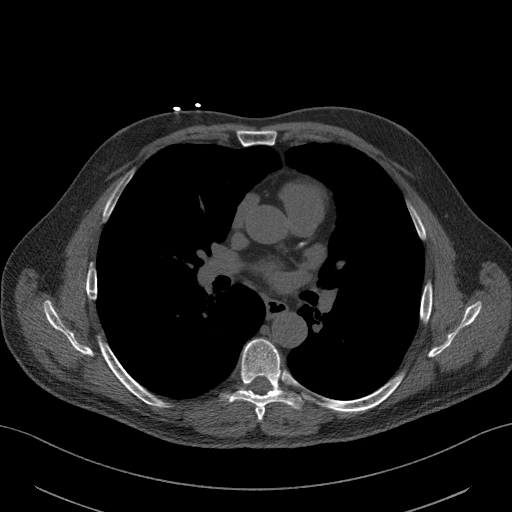
[im 63/76  vessel]
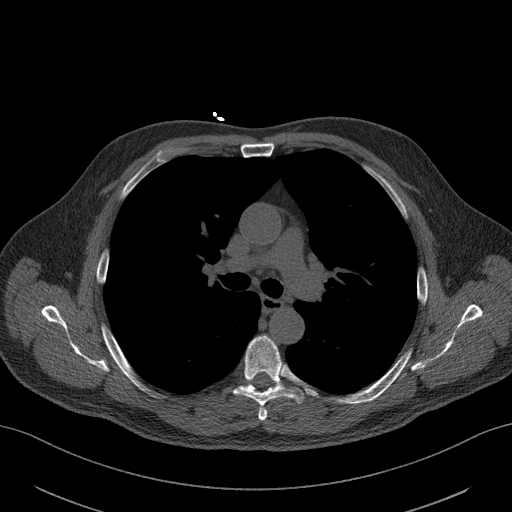
[im 63/76  lung]
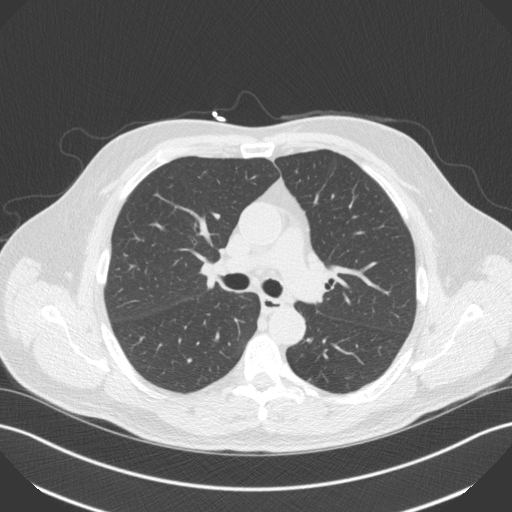

[Series 4: cascseq 2.0 br59 lung · axial · 0.76mm/px · z∈[-289,-189]mm · 5 of 76 slices shown]
[im 13/76  lung]
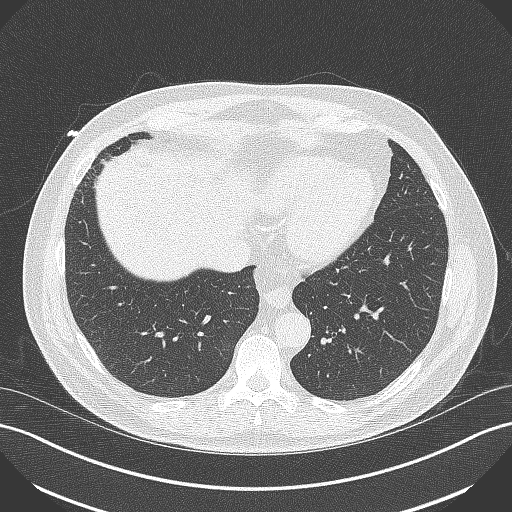
[im 26/76  lung]
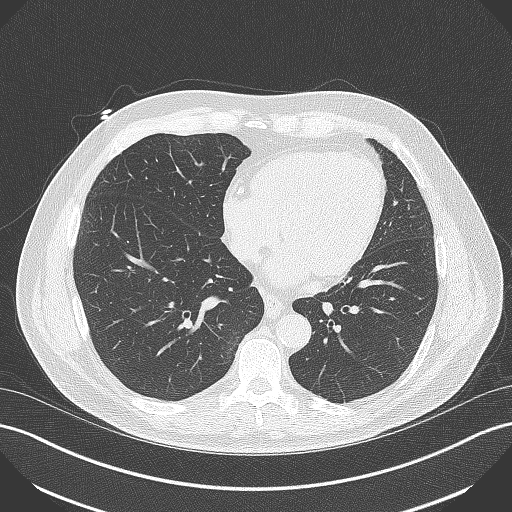
[im 38/76  lung]
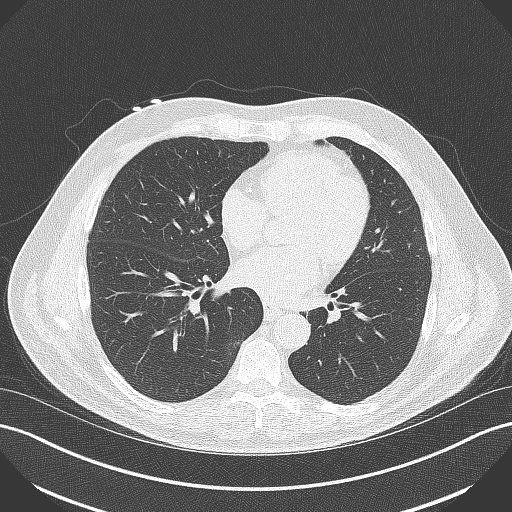
[im 51/76  lung]
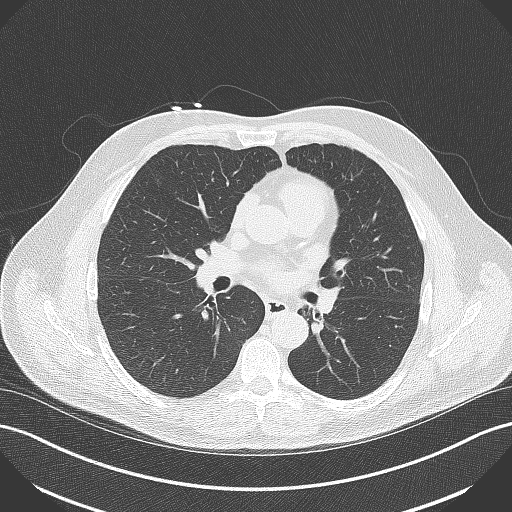
[im 63/76  lung]
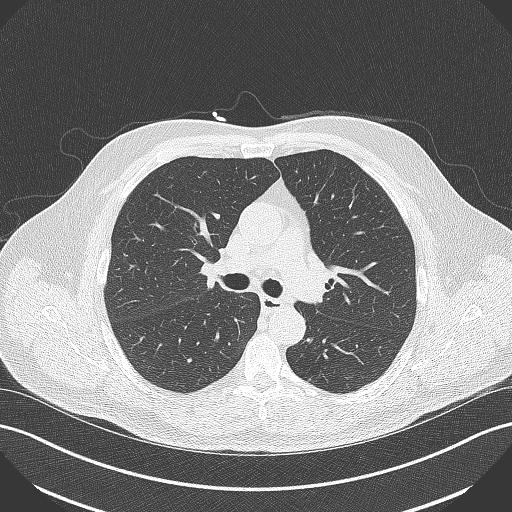

[14 of 20 positions shown; findings below may reference images not displayed]

FINDINGS: Atherosclerotic calcifications in the thoracic aorta. Within the
visualized portions of the thorax there are no suspicious appearing
pulmonary nodules or masses, there is no acute consolidative
airspace disease, no pleural effusions, no pneumothorax and no
lymphadenopathy. Visualized portions of the upper abdomen are
unremarkable. There are no aggressive appearing lytic or blastic
lesions noted in the visualized portions of the skeleton.
IMPRESSION: 1.  Aortic Atherosclerosis (EDDXX-KW0.0).
FINDINGS: Coronary arteries: Normal origins.

Coronary Calcium Score:

Left main: 0

Left anterior descending artery: 585

Left circumflex artery: 279

Right coronary artery: 752

Total: 4343

Percentile: 99th

Pericardium: Normal.

Ascending Aorta: Normal caliber.  Aortic atherosclerosis.

Non-cardiac: See separate report from [REDACTED].
IMPRESSION: Coronary calcium score of 4343. This was 99th percentile for age-,
race-, and sex-matched controls.



If CAC=0, it is reasonable to withhold statin therapy and reassess
in 5 to 10 years, as long as higher risk conditions are absent
(diabetes mellitus, family history of premature CHD in first degree
relatives (males <55 years; females <65 years), cigarette smoking,
or LDL >=190 mg/dL).

If CAC is 1 to 99, it is reasonable to initiate statin therapy for
patients >=55 years of age.

If CAC is >=100 or >=75th percentile, it is reasonable to initiate
statin therapy at any age.

Cardiology referral should be considered for patients with CAC
scores >=400 or >=75th percentile.

*9614 AHA/ACC/AACVPR/AAPA/ABC/MINTU DEOL/STEPAN/HC/Rudi/ERAZO/JUHA-JAAKKO/PETAR NOAH ABDULLAH
Guideline on the Management of Blood Cholesterol: A Report of the
American College of Cardiology/American Heart Association Task Force
on Clinical Practice Guidelines. J Am Coll Cardiol.
8794;73(24):9372-9705.

*** End of Addendum ***
EXAM:
OVER-READ INTERPRETATION  CT CHEST

The following report is an over-read performed by radiologist Dr.
Tripti Tiger [REDACTED] on 09/10/2021. This
over-read does not include interpretation of cardiac or coronary
anatomy or pathology. The coronary calcium score interpretation by
the cardiologist is attached.
FINDINGS: Atherosclerotic calcifications in the thoracic aorta. Within the
visualized portions of the thorax there are no suspicious appearing
pulmonary nodules or masses, there is no acute consolidative
airspace disease, no pleural effusions, no pneumothorax and no
lymphadenopathy. Visualized portions of the upper abdomen are
unremarkable. There are no aggressive appearing lytic or blastic
lesions noted in the visualized portions of the skeleton.
IMPRESSION: 1.  Aortic Atherosclerosis (EDDXX-KW0.0).

## 2023-09-18 DIAGNOSIS — H5203 Hypermetropia, bilateral: Secondary | ICD-10-CM | POA: Diagnosis not present

## 2023-09-18 DIAGNOSIS — H52223 Regular astigmatism, bilateral: Secondary | ICD-10-CM | POA: Diagnosis not present

## 2023-09-18 DIAGNOSIS — H2513 Age-related nuclear cataract, bilateral: Secondary | ICD-10-CM | POA: Diagnosis not present

## 2023-11-06 DIAGNOSIS — C61 Malignant neoplasm of prostate: Secondary | ICD-10-CM | POA: Diagnosis not present

## 2023-11-13 DIAGNOSIS — C61 Malignant neoplasm of prostate: Secondary | ICD-10-CM | POA: Diagnosis not present

## 2023-11-13 DIAGNOSIS — N5201 Erectile dysfunction due to arterial insufficiency: Secondary | ICD-10-CM | POA: Diagnosis not present

## 2023-11-13 DIAGNOSIS — R3915 Urgency of urination: Secondary | ICD-10-CM | POA: Diagnosis not present

## 2023-12-29 DIAGNOSIS — I6529 Occlusion and stenosis of unspecified carotid artery: Secondary | ICD-10-CM | POA: Diagnosis not present

## 2023-12-29 DIAGNOSIS — C61 Malignant neoplasm of prostate: Secondary | ICD-10-CM | POA: Diagnosis not present

## 2023-12-29 DIAGNOSIS — I10 Essential (primary) hypertension: Secondary | ICD-10-CM | POA: Diagnosis not present

## 2023-12-29 DIAGNOSIS — E291 Testicular hypofunction: Secondary | ICD-10-CM | POA: Diagnosis not present

## 2023-12-29 DIAGNOSIS — G4733 Obstructive sleep apnea (adult) (pediatric): Secondary | ICD-10-CM | POA: Diagnosis not present

## 2023-12-29 DIAGNOSIS — E782 Mixed hyperlipidemia: Secondary | ICD-10-CM | POA: Diagnosis not present

## 2023-12-29 DIAGNOSIS — E663 Overweight: Secondary | ICD-10-CM | POA: Diagnosis not present

## 2023-12-29 DIAGNOSIS — Z0000 Encounter for general adult medical examination without abnormal findings: Secondary | ICD-10-CM | POA: Diagnosis not present

## 2024-01-07 ENCOUNTER — Other Ambulatory Visit: Payer: Self-pay | Admitting: Family Medicine

## 2024-01-07 DIAGNOSIS — I10 Essential (primary) hypertension: Secondary | ICD-10-CM | POA: Diagnosis not present

## 2024-01-07 DIAGNOSIS — G629 Polyneuropathy, unspecified: Secondary | ICD-10-CM | POA: Diagnosis not present

## 2024-01-07 DIAGNOSIS — I6529 Occlusion and stenosis of unspecified carotid artery: Secondary | ICD-10-CM | POA: Diagnosis not present

## 2024-01-07 DIAGNOSIS — C61 Malignant neoplasm of prostate: Secondary | ICD-10-CM | POA: Diagnosis not present

## 2024-01-07 DIAGNOSIS — I2510 Atherosclerotic heart disease of native coronary artery without angina pectoris: Secondary | ICD-10-CM

## 2024-01-07 DIAGNOSIS — G4733 Obstructive sleep apnea (adult) (pediatric): Secondary | ICD-10-CM | POA: Diagnosis not present

## 2024-01-07 DIAGNOSIS — E663 Overweight: Secondary | ICD-10-CM | POA: Diagnosis not present

## 2024-01-07 DIAGNOSIS — E291 Testicular hypofunction: Secondary | ICD-10-CM | POA: Diagnosis not present

## 2024-01-07 DIAGNOSIS — E782 Mixed hyperlipidemia: Secondary | ICD-10-CM | POA: Diagnosis not present

## 2024-01-07 DIAGNOSIS — R419 Unspecified symptoms and signs involving cognitive functions and awareness: Secondary | ICD-10-CM | POA: Diagnosis not present

## 2024-01-15 ENCOUNTER — Ambulatory Visit
Admission: RE | Admit: 2024-01-15 | Discharge: 2024-01-15 | Disposition: A | Discharge status: Home / Self Care | Source: Ambulatory Visit | Attending: Family Medicine | Admitting: Family Medicine

## 2024-01-15 DIAGNOSIS — I2510 Atherosclerotic heart disease of native coronary artery without angina pectoris: Secondary | ICD-10-CM

## 2024-01-21 ENCOUNTER — Encounter: Payer: Self-pay | Admitting: Dermatology

## 2024-01-21 ENCOUNTER — Ambulatory Visit (INDEPENDENT_AMBULATORY_CARE_PROVIDER_SITE_OTHER): Payer: BLUE CROSS/BLUE SHIELD | Admitting: Dermatology

## 2024-01-21 DIAGNOSIS — L578 Other skin changes due to chronic exposure to nonionizing radiation: Secondary | ICD-10-CM

## 2024-01-21 DIAGNOSIS — Z86018 Personal history of other benign neoplasm: Secondary | ICD-10-CM

## 2024-01-21 DIAGNOSIS — L820 Inflamed seborrheic keratosis: Secondary | ICD-10-CM

## 2024-01-21 DIAGNOSIS — W908XXA Exposure to other nonionizing radiation, initial encounter: Secondary | ICD-10-CM | POA: Diagnosis not present

## 2024-01-21 DIAGNOSIS — D229 Melanocytic nevi, unspecified: Secondary | ICD-10-CM

## 2024-01-21 DIAGNOSIS — L409 Psoriasis, unspecified: Secondary | ICD-10-CM

## 2024-01-21 DIAGNOSIS — Z872 Personal history of diseases of the skin and subcutaneous tissue: Secondary | ICD-10-CM

## 2024-01-21 DIAGNOSIS — L570 Actinic keratosis: Secondary | ICD-10-CM

## 2024-01-21 DIAGNOSIS — L603 Nail dystrophy: Secondary | ICD-10-CM | POA: Diagnosis not present

## 2024-01-21 DIAGNOSIS — D1801 Hemangioma of skin and subcutaneous tissue: Secondary | ICD-10-CM | POA: Diagnosis not present

## 2024-01-21 DIAGNOSIS — Z85828 Personal history of other malignant neoplasm of skin: Secondary | ICD-10-CM

## 2024-01-21 DIAGNOSIS — L821 Other seborrheic keratosis: Secondary | ICD-10-CM | POA: Diagnosis not present

## 2024-01-21 DIAGNOSIS — Z7189 Other specified counseling: Secondary | ICD-10-CM

## 2024-01-21 DIAGNOSIS — Z1283 Encounter for screening for malignant neoplasm of skin: Secondary | ICD-10-CM | POA: Diagnosis not present

## 2024-01-21 DIAGNOSIS — L814 Other melanin hyperpigmentation: Secondary | ICD-10-CM

## 2024-01-21 NOTE — Progress Notes (Signed)
 Follow-Up Visit   Subjective  Christian Santos is a 73 y.o. male who presents for the following: Skin Cancer Screening and Full Body Skin Exam, hx of BCC, hx of Dysplastic nevus.   The patient presents for Total-Body Skin Exam (TBSE) for skin cancer screening and mole check. The patient has spots, moles and lesions to be evaluated, some may be new or changing and the patient may have concern these could be cancer.  The following portions of the chart were reviewed this encounter and updated as appropriate: medications, allergies, medical history  Review of Systems:  No other skin or systemic complaints except as noted in HPI or Assessment and Plan.  Objective  Well appearing patient in no apparent distress; mood and affect are within normal limits.  A full examination was performed including scalp, head, eyes, ears, nose, lips, neck, chest, axillae, abdomen, back, buttocks, bilateral upper extremities, bilateral lower extremities, hands, feet, fingers, toes, fingernails, and toenails. All findings within normal limits unless otherwise noted below.   Relevant physical exam findings are noted in the Assessment and Plan.  right side x 1 Stuck-on, waxy, tan-brown papule -- Discussed benign etiology and prognosis.  face x 7 (7) Erythematous thin papules/macules with gritty scale.   Assessment & Plan   SKIN CANCER SCREENING PERFORMED TODAY.  LENTIGINES, SEBORRHEIC KERATOSES, HEMANGIOMAS - Benign normal skin lesions - Benign-appearing - Call for any changes  MELANOCYTIC NEVI - Tan-brown and/or pink-flesh-colored symmetric macules and papules - Benign appearing on exam today - Observation - Call clinic for new or changing moles - Recommend daily use of broad spectrum spf 30+ sunscreen to sun-exposed areas.   PSORIASIS - History of - now clear Exam: clear skin  Chronic condition with duration or expected duration over one year. Currently well-controlled.  Psoriasis is a  chronic non-curable, but treatable genetic/hereditary disease that may have other systemic features affecting other organ systems such as joints (Psoriatic Arthritis). It is associated with an increased risk of inflammatory bowel disease, heart disease, non-alcoholic fatty liver disease, and depression.  Treatments include light and laser treatments; topical medications; and systemic medications including oral and injectables. Treatment Plan: No treatment needed    Psoriasis nail dystrophy vs Trauma  Left great toe No treatment needed  If nail becomes bothersome we may send a sample for molecular study    HISTORY OF DYSPLASTIC NEVUS Left posterior shoulder  No evidence of recurrence today Recommend regular full body skin exams Recommend daily broad spectrum sunscreen SPF 30+ to sun-exposed areas, reapply every 2 hours as needed.  Call if any new or changing lesions are noted between office visits    HISTORY OF BASAL CELL CARCINOMA OF THE SKIN Right temple/hairline  - No evidence of recurrence today - Recommend regular full body skin exams - Recommend daily broad spectrum sunscreen SPF 30+ to sun-exposed areas, reapply every 2 hours as needed.  - Call if any new or changing lesions are noted between office visits   INFLAMED SEBORRHEIC KERATOSIS right side x 1 Symptomatic, irritating, patient would like treated.  Destruction of lesion - right side x 1 Complexity: simple   Destruction method: cryotherapy   Informed consent: discussed and consent obtained   Timeout:  patient name, date of birth, surgical site, and procedure verified Lesion destroyed using liquid nitrogen: Yes   Region frozen until ice ball extended beyond lesion: Yes   Outcome: patient tolerated procedure well with no complications   Post-procedure details: wound care instructions given  AK (ACTINIC KERATOSIS) (7) face x 7 (7) ACTINIC DAMAGE - chronic, secondary to cumulative UV radiation exposure/sun exposure  over time - diffuse scaly erythematous macules with underlying dyspigmentation - Recommend daily broad spectrum sunscreen SPF 30+ to sun-exposed areas, reapply every 2 hours as needed.  - Recommend staying in the shade or wearing long sleeves, sun glasses (UVA+UVB protection) and wide brim hats (4-inch brim around the entire circumference of the hat). - Call for new or changing lesions.  Destruction of lesion - face x 7 (7) Complexity: simple   Destruction method: cryotherapy   Informed consent: discussed and consent obtained   Timeout:  patient name, date of birth, surgical site, and procedure verified Lesion destroyed using liquid nitrogen: Yes   Region frozen until ice ball extended beyond lesion: Yes   Outcome: patient tolerated procedure well with no complications   Post-procedure details: wound care instructions given     Return in about 1 year (around 01/20/2025) for TBSE, hx of BCC, hx of Dysplastic nevus .  IClara Crisp, CMA, am acting as scribe for Celine Collard, MD .   Documentation: I have reviewed the above documentation for accuracy and completeness, and I agree with the above.  Celine Collard, MD

## 2024-01-21 NOTE — Patient Instructions (Addendum)

## 2024-03-16 ENCOUNTER — Other Ambulatory Visit: Payer: Self-pay

## 2024-03-16 MED ORDER — KETOCONAZOLE 2 % EX CREA: TOPICAL_CREAM | CUTANEOUS | 1 refills | Status: AC

## 2024-03-16 NOTE — Progress Notes (Signed)
 Patient called asking for RF of cream. aw

## 2024-04-13 ENCOUNTER — Encounter: Payer: Self-pay | Admitting: Gastroenterology

## 2024-05-02 DIAGNOSIS — C61 Malignant neoplasm of prostate: Secondary | ICD-10-CM | POA: Diagnosis not present

## 2024-05-09 DIAGNOSIS — C61 Malignant neoplasm of prostate: Secondary | ICD-10-CM | POA: Diagnosis not present

## 2024-05-09 DIAGNOSIS — N5201 Erectile dysfunction due to arterial insufficiency: Secondary | ICD-10-CM | POA: Diagnosis not present

## 2024-05-09 DIAGNOSIS — R3915 Urgency of urination: Secondary | ICD-10-CM | POA: Diagnosis not present

## 2024-06-03 ENCOUNTER — Encounter: Payer: Self-pay | Admitting: Gastroenterology

## 2024-06-03 ENCOUNTER — Ambulatory Visit: Admitting: Gastroenterology

## 2024-06-03 VITALS — BP 128/66 | HR 56 | Ht 66.0 in | Wt 174.0 lb

## 2024-06-03 DIAGNOSIS — K219 Gastro-esophageal reflux disease without esophagitis: Secondary | ICD-10-CM

## 2024-06-03 MED ORDER — FAMOTIDINE 20 MG PO TABS: 20.0000 mg | ORAL_TABLET | Freq: Every day | ORAL | 3 refills | Status: AC

## 2024-06-03 MED ORDER — OMEPRAZOLE 40 MG PO CPDR: 40.0000 mg | DELAYED_RELEASE_CAPSULE | Freq: Two times a day (BID) | ORAL | 3 refills | Status: AC

## 2024-06-03 NOTE — Progress Notes (Signed)
 Noted

## 2024-06-03 NOTE — Patient Instructions (Addendum)
 We have sent the following medications to your pharmacy for you to pick up at your convenience: Omeprazole  40 mg twice daily 30-60 minutes before breakfast and dinner. Famotidine  20 mg nightly at bedtime.   You have been scheduled for an endoscopy. Please follow written instructions given to you at your visit today.  If you use inhalers (even only as needed), please bring them with you on the day of your procedure.  If you take any of the following medications, they will need to be adjusted prior to your procedure:   DO NOT TAKE 7 DAYS PRIOR TO TEST- Trulicity (dulaglutide) Ozempic, Wegovy (semaglutide) Mounjaro (tirzepatide) Bydureon Bcise (exanatide extended release)  DO NOT TAKE 1 DAY PRIOR TO YOUR TEST Rybelsus (semaglutide) Adlyxin (lixisenatide) Victoza (liraglutide) Byetta (exanatide) ________________________________________________________________________

## 2024-06-03 NOTE — Progress Notes (Signed)
 06/03/2024 Christian Santos [id number] [birth year]   HISTORY OF PRESENT ILLNESS: This is a 73 year old male who is new to our office.  He is here today with his wife to discuss reflux symptoms.  He has had acid reflux for a long time.  Recently he has had severe symptoms since June.  He notices it particularly at nighttime, he feels it come up into his throat.  He did have 1 episode of vomiting and in the emesis they saw specks of red blood.  Currently he is on omeprazole  40 mg in the morning and omeprazole  20 mg at bedtime.  He had also been given some Carafate to use.  Insurance would not cover the suspension so he was given the tablet.  He started out using it regularly when it was first given, but now is using it maybe once a day.  Last EGD they report was in 2012.  Report he had a hiatal hernia.  Colonoscopy dated November 25, 2016 completed by Lamar Holmes:  Two, small 5 mm polyps in the ascending and transverse colon. Tubular adenomas on pathology.   Colonoscopy 05/2022 in [place]: - One 5 mm polyp in the cecum. Biopsied. - Diverticulosis in the cecum. - The distal rectum and anal verge are normal on retroflexion view.  Patient was notified that the pathology from the polyp removed at the time of his June 04, 2022 pathology was benign. Very small tubular adenoma near the orifice of the appendix. Repeat examination in 5 years based on family history of maternal colon cancer.    Past Medical History:  Diagnosis Date   Actinic keratosis 05/28/2020   L temple    Allergic rhinitis    Coronary artery calcification seen on CAT scan    followed by pcp;    CT i epic 11-11-2020  score = 1616//   echo in epic 11-13-2021  ef 60-65%,  mild LAE,  mild MR/ TR no stenosis   DDD (degenerative disc disease), cervical    ED (erectile dysfunction)    GERD (gastroesophageal reflux disease)    History of adenomatous polyp of colon    History of basal cell carcinoma (BCC) excision 05/28/2020    R temple/hairline - ED&C    History of dysplastic nevus 02/20/2014   Left posterior shoulder. Moderate atypia, limited margins free.   Hyperlipidemia    Hypertension    nuclear stress test in epic 05-21-2016  low risk no ischemia normal perfusion, ef 66%   Insomnia    Malignant neoplasm prostate [facility]) 03/2022   urologist--  dr annitta  radiation oncology--- dr patrcia;   dx 06/ 2023  Gleason 3+3   OA (osteoarthritis)    OSA (obstructive sleep apnea) 2020   per pt intolerate to cpap   Psoriasis    Wears glasses    Wears hearing aid in both ears    Past Surgical History:  Procedure Laterality Date   COLONOSCOPY WITH PROPOFOL  N/A 06/04/2022   Procedure: COLONOSCOPY WITH PROPOFOL ;  Surgeon: Dessa Reyes ORN, MD;  Location: [facility];  Service: Endoscopy;  Laterality: N/A;   CYSTOSCOPY  07/30/2022   Procedure: CYSTOSCOPY;  Surgeon: Alvaro Hummer, MD;  Location: [facility];  Service: Urology;;   HAND SURGERY     RADIOACTIVE SEED IMPLANT N/A 07/30/2022   Procedure: RADIOACTIVE SEED IMPLANT/BRACHYTHERAPY IMPLANT;  Surgeon: Alvaro Hummer, MD;  Location[facility];  Service: Urology;  Laterality: N/A;   SPACE OAR INSTILLATION N/A 07/30/2022  Procedure: SPACE OAR INSTILLATION;  Surgeon: Alvaro Hummer, MD;  Location: [facility];  Service: Urology;  Laterality: N/A;    reports that he has never smoked. He has never used smokeless tobacco. He reports that he does not currently use alcohol. He reports that he does not use drugs. family history includes ADD / ADHD in his daughter; Anxiety disorder in his daughter; Arthritis in his maternal grandmother; Asthma in his daughter and daughter; Bladder Cancer in his father; Cancer in his father and mother; Colon cancer in his mother; Hearing loss in his mother; Heart attack in his brother and father; Heart disease in his brother and father; Hypertension in his father; Liver cancer in his mother;  Lung cancer in his mother; Pancreatic cancer in his father. Allergies  Allergen Reactions   Erythromycin Base Other (See Comments)    Unknown reaction   Other Other (See Comments)    Mycins = chest pain [place] = skin test results.    Zestril [Lisinopril] Cough   Clobetasol Rash    Blistering rash     Wool Alcohol  [Lanolin] Rash      Outpatient Encounter Medications as of 06/03/2024  Medication Sig   CARAFATE 1 GM/10ML suspension Take 1 g by mouth 4 (four) times daily.   acetaminophen  (TYLENOL ) 650 MG CR tablet Take 650 mg by mouth every 8 (eight) hours as needed for pain.   amLODipine  (NORVASC ) 5 MG tablet Take 1 tablet (5 mg total) by mouth daily.   aspirin  EC (ASPIRIN  81) 81 MG tablet Take 81 mg by mouth daily.   Cholecalciferol (VITAMIN D -3 PO) Take 1 capsule by mouth daily.   ELDERBERRY PO Take 1 each by mouth daily. Elderberry gummy   ezetimibe  (ZETIA ) 10 MG tablet Take 1 tablet (10 mg total) by mouth daily.   ketoconazole  (NIZORAL ) 2 % cream Apply to groin area Tue, Thur and Sat at bedtime   MAGNESIUM OXIDE PO Take 1 tablet by mouth at bedtime.   melatonin 5 MG TABS Take 5 mg by mouth at bedtime.   metoprolol succinate (TOPROL-XL) 50 MG 24 hr tablet Take 1 tablet by mouth at bedtime.   montelukast (SINGULAIR) 10 MG tablet Take 10 mg by mouth at bedtime.   Multiple Vitamins-Minerals (MULTIVITAMIN WITH MINERALS) tablet Take 1 tablet by mouth daily.   omeprazole  (PRILOSEC) 40 MG capsule Take 1 capsule (40 mg total) by mouth daily.   pimecrolimus  (ELIDEL ) 1 % cream APPLY TO AFFECTED AREAS OF PSORIASIS ONCE OR TWICE DAILY AS NEEDED (Patient taking differently: Apply 1 Application topically 2 (two) times daily as needed (psoriasis).)   Probiotic Product (PROBIOTIC ADVANCED PO) Take 1 capsule by mouth daily.   tamsulosin  (FLOMAX ) 0.4 MG CAPS capsule Take 1 capsule (0.4 mg total) by mouth daily as needed. For urinary urgency / weak stream after prostate radiation. (Patient  taking differently: Take 0.4 mg by mouth daily. For urinary urgency / weak stream after prostate radiation.)   traZODone (DESYREL) 50 MG tablet Take 50 mg by mouth at bedtime.   [DISCONTINUED] doxylamine, Sleep, (UNISOM) 25 MG tablet Take 50 mg by mouth at bedtime as needed for sleep.   [DISCONTINUED] sildenafil  (VIAGRA ) 100 MG tablet Take 100 mg by mouth as needed for erectile dysfunction.   No facility-administered encounter medications on file as of 06/03/2024.    REVIEW OF SYSTEMS  : All other systems reviewed and negative except where noted in the History of Present Illness.   PHYSICAL EXAM:  BP 128/66 (BP Location: Right Arm, Patient Position: Sitting, Cuff Size: Normal)   Pulse (!) 56   Ht 5' 6 (1.676 m)   Wt 174 lb (78.9 kg)   BMI 28.08 kg/m  General: Well developed white male in no acute distress Head: Normocephalic and atraumatic Eyes:  Sclerae anicteric, conjunctiva pink. Ears: Normal auditory acuity Lungs: Clear throughout to auscultation; no W/R/R. Heart: Regular rate and rhythm; no M/R/G. Abdomen: Soft, non-distended.  BS present.  Minimal epigastric tenderness to palpation.  Ventral hernia/diastases recti noted. Musculoskeletal: Symmetrical with no gross deformities  Skin: No lesions on visible extremities Extremities: No edema  Neurological: Alert oriented x 4, grossly non-focal Psychological:  Alert and cooperative. Normal mood and affect  ASSESSMENT AND PLAN: *GERD: Has had a lot of symptoms over the years, but a lot worse over the past few months, particularly at nighttime.  Last EGD they report was in 2012.  Report he had a hiatal hernia.  He had an episode recently with small specks of blood with an episode of emesis.  Will max out his reflux regimen with his omeprazole  40 mg twice daily in the morning and in the evening and I am going to add famotidine  20 mg at bedtime.  Prescription sent to pharmacy.  Will plan for EGD with Dr. Abran.  The risks, benefits, and  alternatives to EGD were discussed with the patient and he consents to proceed.  He does have Carafate tablets at home and can continue that for now as well if desired/needed.  Can mix in 1 to 2 ounces of water  and make that into a slurry to use.  CC:  Christian Santos, Ozell, MD

## 2024-06-08 ENCOUNTER — Encounter: Payer: Self-pay | Admitting: Internal Medicine

## 2024-06-08 ENCOUNTER — Ambulatory Visit (AMBULATORY_SURGERY_CENTER): Admitting: Internal Medicine

## 2024-06-08 VITALS — BP 106/57 | HR 52 | Temp 97.7°F | Resp 15 | Ht 66.0 in | Wt 174.0 lb

## 2024-06-08 DIAGNOSIS — G4733 Obstructive sleep apnea (adult) (pediatric): Secondary | ICD-10-CM | POA: Diagnosis not present

## 2024-06-08 DIAGNOSIS — K219 Gastro-esophageal reflux disease without esophagitis: Secondary | ICD-10-CM

## 2024-06-08 DIAGNOSIS — E785 Hyperlipidemia, unspecified: Secondary | ICD-10-CM | POA: Diagnosis not present

## 2024-06-08 DIAGNOSIS — K449 Diaphragmatic hernia without obstruction or gangrene: Secondary | ICD-10-CM | POA: Diagnosis not present

## 2024-06-08 DIAGNOSIS — I10 Essential (primary) hypertension: Secondary | ICD-10-CM | POA: Diagnosis not present

## 2024-06-08 MED ORDER — SODIUM CHLORIDE 0.9 % IV SOLN: 500.0000 mL | INTRAVENOUS | Status: DC

## 2024-06-08 NOTE — Patient Instructions (Signed)
 Educational handout provided to patient related to HIATAL HERNIA, GERD  Resume previous diet  Continue present medications  Reflux precautions   YOU HAD AN ENDOSCOPIC PROCEDURE TODAY AT THE [facility]:   Refer to the procedure report that was given to you for any specific questions about what was found during the examination.  If the procedure report does not answer your questions, please call your gastroenterologist to clarify.  If you requested that your care partner not be given the details of your procedure findings, then the procedure report has been included in a sealed envelope for you to review at your convenience later.  YOU SHOULD EXPECT: Some feelings of bloating in the abdomen. Passage of more gas than usual.  Walking can help get rid of the air that was put into your GI tract during the procedure and reduce the bloating. If you had a lower endoscopy (such as a colonoscopy or flexible sigmoidoscopy) you may notice spotting of blood in your stool or on the toilet paper. If you underwent a bowel prep for your procedure, you may not have a normal bowel movement for a few days.  Please Note:  You might notice some irritation and congestion in your nose or some drainage.  This is from the oxygen used during your procedure.  There is no need for concern and it should clear up in a day or so.  SYMPTOMS TO REPORT IMMEDIATELY:  Following upper endoscopy (EGD)  Vomiting of blood or coffee ground material  New chest pain or pain under the shoulder blades  Painful or persistently difficult swallowing  New shortness of breath  Fever of 100F or higher  Black, tarry-looking stools  For urgent or emergent issues, a gastroenterologist can be reached at any hour by calling [phone]. Do not use MyChart messaging for urgent concerns.    DIET:  We do recommend a small meal at first, but then you may proceed to your regular diet.  Drink plenty of fluids but you should avoid  alcoholic beverages for 24 hours.  ACTIVITY:  You should plan to take it easy for the rest of today and you should NOT DRIVE or use heavy machinery until tomorrow (because of the sedation medicines used during the test).    FOLLOW UP: Our staff will call the number listed on your records the next business day following your procedure.  We will call around 7:15- 8:00 am to check on you and address any questions or concerns that you may have regarding the information given to you following your procedure. If we do not reach you, we will leave a message.     If any biopsies were taken you will be contacted by phone or by letter within the next 1-3 weeks.  Please call us  at [phone] if you have not heard about the biopsies in 3 weeks.    SIGNATURES/CONFIDENTIALITY: You and/or your care partner have signed paperwork which will be entered into your electronic medical record.  These signatures attest to the fact that that the information above on your After Visit Summary has been reviewed and is understood.  Full responsibility of the confidentiality of this discharge information lies with you and/or your care-partner.

## 2024-06-08 NOTE — Op Note (Signed)
 [facility] Patient Name: Lake Cinquemani Procedure Date: 06/08/2024 11:09 AM MRN: [id number] Endoscopist: Norleen SAILOR. Abran , MD, [id number] Age: 73 Referring MD:  Date of Birth: [birth year] Gender: Male Account #: [id number] Procedure:                Upper GI endoscopy Indications:              Esophageal reflux. Refractory symptoms despite PPI.                            Episode of nausea with vomiting minor hematemesis Medicines:                Monitored Anesthesia Care Procedure:                Pre-Anesthesia Assessment:                           - Prior to the procedure, a History and Physical                            was performed, and patient medications and                            allergies were reviewed. The patient's tolerance of                            previous anesthesia was also reviewed. The risks                            and benefits of the procedure and the sedation                            options and risks were discussed with the patient.                            All questions were answered, and informed consent                            was obtained. Prior Anticoagulants: The patient has                            taken no anticoagulant or antiplatelet agents. ASA                            Grade Assessment: II - A patient with mild systemic                            disease. After reviewing the risks and benefits,                            the patient was deemed in satisfactory condition to                            undergo the procedure.  After obtaining informed consent, the endoscope was                            passed under direct vision. Throughout the                            procedure, the patient's blood pressure, pulse, and                            oxygen saturations were monitored continuously. The                            Olympus Scope [id number] was introduced through the                             mouth, and advanced to the second part of duodenum.                            The upper GI endoscopy was accomplished without                            difficulty. The patient tolerated the procedure                            well. Scope In: Scope Out: Findings:                 The esophagus was normal.                           The stomach for a large (5 cm) sliding hiatal                            hernia). The stomach was otherwise unremarkable.                           The examined duodenum was normal.                           The cardia and gastric fundus were normal on                            retroflexion. Complications:            No immediate complications. Estimated Blood Loss:     Estimated blood loss: none. Impression:               1. EGD with large hiatal hernia. Otherwise normal.                           2. GERD. Recommendation:           - Patient has a contact number available for                            emergencies. The signs and symptoms of potential  delayed complications were discussed with the                            patient. Return to normal activities tomorrow.                            Written discharge instructions were provided to the                            patient.                           - Resume previous diet.                           - Continue present medications.                           - Reflux precautions                           - Do not eat within 4 hours of bedtime                           - Elevate head of bed                           - Continue antireflux medicines as recently adjusted                           - Return to the care of your primary provider. GI                            follow-up as needed Norleen SAILOR. Abran, MD 06/08/2024 11:36:34 AM This report has been signed electronically.

## 2024-06-08 NOTE — Progress Notes (Signed)
 06/03/2024 Christian Santos [id number] 1951-07-25     HISTORY OF PRESENT ILLNESS: This is a 73 year old male who is new to our office.  He is here today with his wife to discuss reflux symptoms.  He has had acid reflux for a long time.  Recently he has had severe symptoms since June.  He notices it particularly at nighttime, he feels it come up into his throat.  He did have 1 episode of vomiting and in the emesis they saw specks of red blood.  Currently he is on omeprazole  40 mg in the morning and omeprazole  20 mg at bedtime.  He had also been given some Carafate to use.  Insurance would not cover the suspension so he was given the tablet.  He started out using it regularly when it was first given, but now is using it maybe once a day.  Last EGD they report was in 2012.  Report he had a hiatal hernia.   Colonoscopy dated November 25, 2016 completed by Lamar Holmes:  Two, small 5 mm polyps in the ascending and transverse colon. Tubular adenomas on pathology.    Colonoscopy 05/2022 in [place]: - One 5 mm polyp in the cecum. Biopsied. - Diverticulosis in the cecum. - The distal rectum and anal verge are normal on retroflexion view.   Patient was notified that the pathology from the polyp removed at the time of his June 04, 2022 pathology was benign. Very small tubular adenoma near the orifice of the appendix. Repeat examination in 5 years based on family history of maternal colon cancer.          Past Medical History:  Diagnosis Date   Actinic keratosis 05/28/2020    L temple    Allergic rhinitis     Coronary artery calcification seen on CAT scan      followed by pcp;    CT i epic 11-11-2020  score = 1616//   echo in epic 11-13-2021  ef 60-65%,  mild LAE,  mild MR/ TR no stenosis   DDD (degenerative disc disease), cervical     ED (erectile dysfunction)     GERD (gastroesophageal reflux disease)     History of adenomatous polyp of colon     History of basal cell carcinoma  (BCC) excision 05/28/2020    R temple/hairline - ED&C    History of dysplastic nevus 02/20/2014    Left posterior shoulder. Moderate atypia, limited margins free.   Hyperlipidemia     Hypertension      nuclear stress test in epic 05-21-2016  low risk no ischemia normal perfusion, ef 66%   Insomnia     Malignant neoplasm prostate [facility]) 03/2022    urologist--  dr annitta  radiation oncology--- dr patrcia;   dx 06/ 2023  Gleason 3+3   OA (osteoarthritis)     OSA (obstructive sleep apnea) 2020    per pt intolerate to cpap   Psoriasis     Wears glasses     Wears hearing aid in both ears               Past Surgical History:  Procedure Laterality Date   COLONOSCOPY WITH PROPOFOL  N/A 06/04/2022    Procedure: COLONOSCOPY WITH PROPOFOL ;  Surgeon: Dessa Reyes ORN, MD;  Location: [facility];  Service: Endoscopy;  Laterality: N/A;   CYSTOSCOPY   07/30/2022    Procedure: CYSTOSCOPY;  Surgeon: Alvaro Hummer, MD;  Location: [facility];  Service:  Urology;;   HAND SURGERY       RADIOACTIVE SEED IMPLANT N/A 07/30/2022    Procedure: RADIOACTIVE SEED IMPLANT/BRACHYTHERAPY IMPLANT;  Surgeon: Alvaro Hummer, MD;  Location: [facility];  Service: Urology;  Laterality: N/A;   SPACE OAR INSTILLATION N/A 07/30/2022    Procedure: SPACE OAR INSTILLATION;  Surgeon: Alvaro Hummer, MD;  Location: [facility];  Service: Urology;  Laterality: N/A;         reports that he has never smoked. He has never used smokeless tobacco. He reports that he does not currently use alcohol. He reports that he does not use drugs. family history includes ADD / ADHD in his daughter; Anxiety disorder in his daughter; Arthritis in his maternal grandmother; Asthma in his daughter and daughter; Bladder Cancer in his father; Cancer in his father and mother; Colon cancer in his mother; Hearing loss in his mother; Heart attack in his brother and father; Heart disease in his brother  and father; Hypertension in his father; Liver cancer in his mother; Lung cancer in his mother; Pancreatic cancer in his father. Allergies       Allergies  Allergen Reactions   Erythromycin Base Other (See Comments)      Unknown reaction   Other Other (See Comments)      Mycins = chest pain [place] = skin test results.     Zestril [Lisinopril] Cough   Clobetasol Rash      Blistering rash      Wool Alcohol  [Lanolin] Rash              Outpatient Encounter Medications as of 06/03/2024  Medication Sig   CARAFATE 1 GM/10ML suspension Take 1 g by mouth 4 (four) times daily.   acetaminophen  (TYLENOL ) 650 MG CR tablet Take 650 mg by mouth every 8 (eight) hours as needed for pain.   amLODipine  (NORVASC ) 5 MG tablet Take 1 tablet (5 mg total) by mouth daily.   aspirin  EC (ASPIRIN  81) 81 MG tablet Take 81 mg by mouth daily.   Cholecalciferol (VITAMIN D -3 PO) Take 1 capsule by mouth daily.   ELDERBERRY PO Take 1 each by mouth daily. Elderberry gummy   ezetimibe  (ZETIA ) 10 MG tablet Take 1 tablet (10 mg total) by mouth daily.   ketoconazole  (NIZORAL ) 2 % cream Apply to groin area Tue, Thur and Sat at bedtime   MAGNESIUM OXIDE PO Take 1 tablet by mouth at bedtime.   melatonin 5 MG TABS Take 5 mg by mouth at bedtime.   metoprolol succinate (TOPROL-XL) 50 MG 24 hr tablet Take 1 tablet by mouth at bedtime.   montelukast (SINGULAIR) 10 MG tablet Take 10 mg by mouth at bedtime.   Multiple Vitamins-Minerals (MULTIVITAMIN WITH MINERALS) tablet Take 1 tablet by mouth daily.   omeprazole  (PRILOSEC) 40 MG capsule Take 1 capsule (40 mg total) by mouth daily.   pimecrolimus  (ELIDEL ) 1 % cream APPLY TO AFFECTED AREAS OF PSORIASIS ONCE OR TWICE DAILY AS NEEDED (Patient taking differently: Apply 1 Application topically 2 (two) times daily as needed (psoriasis).)   Probiotic Product (PROBIOTIC ADVANCED PO) Take 1 capsule by mouth daily.   tamsulosin  (FLOMAX ) 0.4 MG CAPS capsule Take 1 capsule (0.4 mg  total) by mouth daily as needed. For urinary urgency / weak stream after prostate radiation. (Patient taking differently: Take 0.4 mg by mouth daily. For urinary urgency / weak stream after prostate radiation.)   traZODone (DESYREL) 50 MG tablet Take 50 mg by  mouth at bedtime.   [DISCONTINUED] doxylamine, Sleep, (UNISOM) 25 MG tablet Take 50 mg by mouth at bedtime as needed for sleep.   [DISCONTINUED] sildenafil  (VIAGRA ) 100 MG tablet Take 100 mg by mouth as needed for erectile dysfunction.      No facility-administered encounter medications on file as of 06/03/2024.        REVIEW OF SYSTEMS  : All other systems reviewed and negative except where noted in the History of Present Illness.     PHYSICAL EXAM: BP 128/66 (BP Location: Right Arm, Patient Position: Sitting, Cuff Size: Normal)   Pulse (!) 56   Ht 5' 6 (1.676 m)   Wt 174 lb (78.9 kg)   BMI 28.08 kg/m  General: Well developed white male in no acute distress Head: Normocephalic and atraumatic Eyes:  Sclerae anicteric, conjunctiva pink. Ears: Normal auditory acuity Lungs: Clear throughout to auscultation; no W/R/R. Heart: Regular rate and rhythm; no M/R/G. Abdomen: Soft, non-distended.  BS present.  Minimal epigastric tenderness to palpation.  Ventral hernia/diastases recti noted. Musculoskeletal: Symmetrical with no gross deformities  Skin: No lesions on visible extremities Extremities: No edema  Neurological: Alert oriented x 4, grossly non-focal Psychological:  Alert and cooperative. Normal mood and affect   ASSESSMENT AND PLAN: *GERD: Has had a lot of symptoms over the years, but a lot worse over the past few months, particularly at nighttime.  Last EGD they report was in 2012.  Report he had a hiatal hernia.  He had an episode recently with small specks of blood with an episode of emesis.  Will max out his reflux regimen with his omeprazole  40 mg twice daily in the morning and in the evening and I am going to add famotidine   20 mg at bedtime.  Prescription sent to pharmacy.  Will plan for EGD with Dr. Abran.  The risks, benefits, and alternatives to EGD were discussed with the patient and he consents to proceed.  He does have Carafate tablets at home and can continue that for now as well if desired/needed.  Can mix in 1 to 2 ounces of water  and make that into a slurry to use.   CC:  Hilts, Ozell, MD

## 2024-06-08 NOTE — Progress Notes (Signed)
 Pt's states no medical or surgical changes since previsit or office visit.

## 2024-06-09 ENCOUNTER — Telehealth: Payer: Self-pay

## 2024-06-09 NOTE — Telephone Encounter (Signed)
 Post procedure follow up call, no answer

## 2024-09-15 DIAGNOSIS — R739 Hyperglycemia, unspecified: Secondary | ICD-10-CM | POA: Diagnosis not present

## 2025-01-26 ENCOUNTER — Ambulatory Visit: Admitting: Dermatology
